# Patient Record
Sex: Male | Born: 1965 | Race: White | Hispanic: No | Marital: Married | Smoking: Former smoker
Health system: Southern US, Community
[De-identification: ages and names within clinical notes are randomized; demographics above are authoritative.]

## PROBLEM LIST (undated history)

## (undated) DIAGNOSIS — R51 Headache: Secondary | ICD-10-CM

## (undated) DIAGNOSIS — R7303 Prediabetes: Secondary | ICD-10-CM

## (undated) DIAGNOSIS — R519 Headache, unspecified: Secondary | ICD-10-CM

## (undated) DIAGNOSIS — T7840XA Allergy, unspecified, initial encounter: Secondary | ICD-10-CM

## (undated) DIAGNOSIS — E119 Type 2 diabetes mellitus without complications: Secondary | ICD-10-CM

## (undated) DIAGNOSIS — I10 Essential (primary) hypertension: Secondary | ICD-10-CM

## (undated) HISTORY — DX: Type 2 diabetes mellitus without complications: E11.9

## (undated) HISTORY — PX: SHOULDER SURGERY: SHX246

## (undated) HISTORY — DX: Allergy, unspecified, initial encounter: T78.40XA

## (undated) HISTORY — PX: OTHER SURGICAL HISTORY: SHX169

---

## 1996-12-20 HISTORY — PX: SHOULDER SURGERY: SHX246

## 2010-02-27 ENCOUNTER — Emergency Department (HOSPITAL_COMMUNITY): Admission: EM | Admit: 2010-02-27 | Discharge: 2010-02-27 | Payer: Self-pay | Admitting: Emergency Medicine

## 2010-03-18 ENCOUNTER — Encounter: Admission: RE | Admit: 2010-03-18 | Discharge: 2010-03-18 | Payer: Self-pay | Admitting: Family Medicine

## 2011-03-15 LAB — POCT I-STAT, CHEM 8
Calcium, Ion: 1.2 mmol/L (ref 1.12–1.32)
Glucose, Bld: 89 mg/dL (ref 70–99)
Sodium: 142 mEq/L (ref 135–145)
TCO2: 25 mmol/L (ref 0–100)

## 2011-03-15 LAB — CBC
HCT: 44.4 % (ref 39.0–52.0)
MCV: 87.8 fL (ref 78.0–100.0)
RBC: 5.06 MIL/uL (ref 4.22–5.81)
RDW: 12.8 % (ref 11.5–15.5)
WBC: 5.3 10*3/uL (ref 4.0–10.5)

## 2011-03-15 LAB — DIFFERENTIAL
Eosinophils Relative: 4 % (ref 0–5)
Lymphocytes Relative: 27 % (ref 12–46)
Monocytes Relative: 9 % (ref 3–12)
Neutro Abs: 3.1 10*3/uL (ref 1.7–7.7)

## 2011-03-15 LAB — POCT CARDIAC MARKERS
CKMB, poc: 1.9 ng/mL (ref 1.0–8.0)
Myoglobin, poc: 56.6 ng/mL (ref 12–200)
Troponin i, poc: <0.05 ng/mL (ref 0.00–0.09)

## 2017-04-13 ENCOUNTER — Ambulatory Visit: Payer: Self-pay | Admitting: Surgery

## 2017-04-13 ENCOUNTER — Other Ambulatory Visit: Payer: Self-pay | Admitting: Surgery

## 2017-04-13 DIAGNOSIS — D125 Benign neoplasm of sigmoid colon: Secondary | ICD-10-CM

## 2017-04-19 ENCOUNTER — Ambulatory Visit
Admission: RE | Admit: 2017-04-19 | Discharge: 2017-04-19 | Disposition: A | Discharge status: Home / Self Care | Payer: 59 | Source: Ambulatory Visit | Attending: Surgery | Admitting: Surgery

## 2017-04-19 DIAGNOSIS — D125 Benign neoplasm of sigmoid colon: Secondary | ICD-10-CM

## 2017-04-19 MED ORDER — IOPAMIDOL (ISOVUE-300) INJECTION 61%
125.0000 mL | Freq: Once | INTRAVENOUS | Status: AC | PRN
  Administered 2017-04-19: 125 mL via INTRAVENOUS

## 2017-04-20 ENCOUNTER — Ambulatory Visit: Payer: Self-pay | Admitting: Surgery

## 2017-04-20 DIAGNOSIS — D374 Neoplasm of uncertain behavior of colon: Secondary | ICD-10-CM | POA: Insufficient documentation

## 2017-05-31 NOTE — Patient Instructions (Signed)
William Adkins  05/31/2017   Your procedure is scheduled on: 06-08-17  Report to [facility] Main  Entrance Take [facility]  elevators to 3rd floor to  [place] [facility] [place].   Call this number if you have problems the morning of surgery [phone]   Remember: ONLY 1 PERSON MAY GO WITH YOU TO SHORT STAY TO GET  READY MORNING OF YOUR SURGERY.  Do not eat food After Midnight on Monday 06-06-17. Drink plenty of clear liquids all day Tuesday 06-07-17 and follow all of Dr Johney [place] bowel prep instructions. Continue clear liquids until 7am morning of surgery. Nothing by mouth after 7am!     Take these medicines the morning of surgery with A SIP OF WATER: amlodipine(norvasc), loratadine as needed, eye drops , nasal spray                                 You may not have any metal on your body including hair pins and              piercings  Do not wear jewelry, make-up, lotions, powders or perfumes, deodorant              Men may shave face and neck.   Do not bring valuables to the hospital. [place].  Contacts, dentures or bridgework may not be worn into surgery.  Leave suitcase in the car. After surgery it may be brought to your room.               Please read over the following fact sheets you were given: _____________________________________________________________________   COLON BOWEL PREP  Please follow the instructions carefully. It is important to clean out your bowels & take the prescribed antibiotic pills to lower your chances of a wound infection or abscess.   FIVE DAYS PRIOR TO YOUR SURGERY Stop eating any nuts, popcorn, or fruit with seeds. Stop all fiber supplements such as Metamucil, Citrucel, etc.   Hold taking any blood thinning anticoagulation medication (ex: aspirin, warfarin/Coumadin, Plavix, Xarelto, Eliquis, Pradaxa, etc) as recommended by your medical/cardiology doctor  Obtain what you need at a  pharmacy of your choice: -Filled out prescriptions for your oral antibiotics (Neomycin & Metronidazole)  -A bottle of MiraLax / Glycolax (288g) - no prescription required  -A large bottle of Gatorade / Powerade (64oz)  -Dulcolax tablets (4 tabs) - no prescription required   DAY PRIOR TO SURGERY   7:00am Swallow 4 Dulcolax tablets with some water Drink plenty of clear liquids all day to avoid getting dehydrated (Water, juice, soda, coffee, tea, bouillon, jello, etc.)  10:00am Mix the bottle of MiraLax with the 64-oz bottle of Gatorade.  Drink the Gatorade mixture gradually over the next few hours (8oz glass every 15-30 minutes) until gone. You should finish by 2pm.  2:00pm Take 2 Neomycin 500mg  tablets & 2 Metronidazole 500mg  tablets  3:00pm Take 2 Neomycin 500mg  tablets & 2 Metronidazole 500mg  tablets  Drink plenty of clear liquids all evening to avoid getting dehydrated  10:00pm Take 2 Neomycin 500mg  tablets & 2 Metronidazole 500mg  tablets  Do not eat or drink anything after bedtime (midnight) the night before your surgery.   MORNING OF SURGERY Remember to not to drink  or eat anything that morning  Hold or take medications as recommended by the hospital staff at your Preoperative visit  If you have questions or concerns, please call [facility] ([phone] during business hours to speak to the clinical staff for advice.     CLEAR LIQUID DIET   Foods Allowed                                                                     Foods Excluded  Coffee and tea, regular and decaf                             liquids that you cannot  Plain Jell-O in any flavor                                             see through such as: Fruit ices (not with fruit pulp)                                     milk, soups, orange juice  Iced Popsicles                                    All solid food Carbonated beverages, regular and diet                                     Cranberry, grape and apple juices Sports drinks like Gatorade Lightly seasoned clear broth or consume(fat free) Sugar, honey syrup  Sample Menu Breakfast                                Lunch                                     Supper Cranberry juice                    Beef broth                            Chicken broth Jell-O                                     Grape juice                           Apple juice Coffee or tea                        Jell-O  Popsicle                                                Coffee or tea                        Coffee or tea  _____________________________________________________________________  [facility] - Preparing for Surgery Before surgery, you can play an important role.  Because skin is not sterile, your skin needs to be as free of germs as possible.  You can reduce the number of germs on your skin by washing with CHG (chlorahexidine gluconate) soap before surgery.  CHG is an antiseptic cleaner which kills germs and bonds with the skin to continue killing germs even after washing. Please DO NOT use if you have an allergy to CHG or antibacterial soaps.  If your skin becomes reddened/irritated stop using the CHG and inform your nurse when you arrive at Short Stay. Do not shave (including legs and underarms) for at least 48 hours prior to the first CHG shower.  You may shave your face/neck. Please follow these instructions carefully:  1.  Shower with CHG Soap the night before surgery and the  morning of Surgery.  2.  If you choose to wash your hair, wash your hair first as usual with your  normal  shampoo.  3.  After you shampoo, rinse your hair and body thoroughly to remove the  shampoo.                           4.  Use CHG as you would any other liquid soap.  You can apply chg directly  to the skin and wash                       Gently with a scrungie or clean washcloth.  5.  Apply the CHG Soap to your body ONLY  FROM THE NECK DOWN.   Do not use on face/ open                           Wound or open sores. Avoid contact with eyes, ears mouth and genitals (private parts).                       Wash face,  Genitals (private parts) with your normal soap.             6.  Wash thoroughly, paying special attention to the area where your surgery  will be performed.  7.  Thoroughly rinse your body with warm water from the neck down.  8.  DO NOT shower/wash with your normal soap after using and rinsing off  the CHG Soap.                9.  Pat yourself dry with a clean towel.            10.  Wear clean pajamas.            11.  Place clean sheets on your bed the night of your first shower and do not  sleep with pets. Day of Surgery : Do not apply any lotions/deodorants the morning of surgery.  Please wear clean clothes to the hospital/surgery  center.  FAILURE TO FOLLOW THESE INSTRUCTIONS MAY RESULT IN THE CANCELLATION OF YOUR SURGERY PATIENT SIGNATURE_________________________________  NURSE SIGNATURE__________________________________  ________________________________________________________________________  WHAT IS A BLOOD TRANSFUSION? Blood Transfusion Information  A transfusion is the replacement of blood or some of its parts. Blood is made up of multiple cells which provide different functions.  Red blood cells carry oxygen and are used for blood loss replacement.  White blood cells fight against infection.  Platelets control bleeding.  Plasma helps clot blood.  Other blood products are available for specialized needs, such as hemophilia or other clotting disorders. BEFORE THE TRANSFUSION  Who gives blood for transfusions?   Healthy volunteers who are fully evaluated to make sure their blood is safe. This is blood bank blood. Transfusion therapy is the safest it has ever been in the practice of medicine. Before blood is taken from a donor, a complete history is taken to make sure that person has  no history of diseases nor engages in risky social behavior (examples are intravenous drug use or sexual activity with multiple partners). The donor's travel history is screened to minimize risk of transmitting infections, such as malaria. The donated blood is tested for signs of infectious diseases, such as HIV and hepatitis. The blood is then tested to be sure it is compatible with you in order to minimize the chance of a transfusion reaction. If you or a relative donates blood, this is often done in anticipation of surgery and is not appropriate for emergency situations. It takes many days to process the donated blood. RISKS AND COMPLICATIONS Although transfusion therapy is very safe and saves many lives, the main dangers of transfusion include:   Getting an infectious disease.  Developing a transfusion reaction. This is an allergic reaction to something in the blood you were given. Every precaution is taken to prevent this. The decision to have a blood transfusion has been considered carefully by your caregiver before blood is given. Blood is not given unless the benefits outweigh the risks. AFTER THE TRANSFUSION  Right after receiving a blood transfusion, you will usually feel much better and more energetic. This is especially true if your red blood cells have gotten low (anemic). The transfusion raises the level of the red blood cells which carry oxygen, and this usually causes an energy increase.  The nurse administering the transfusion will monitor you carefully for complications. HOME CARE INSTRUCTIONS  No special instructions are needed after a transfusion. You may find your energy is better. Speak with your caregiver about any limitations on activity for underlying diseases you may have. SEEK MEDICAL CARE IF:   Your condition is not improving after your transfusion.  You develop redness or irritation at the intravenous (IV) site. SEEK IMMEDIATE MEDICAL CARE IF:  Any of the following  symptoms occur over the next 12 hours:  Shaking chills.  You have a temperature by mouth above 102 F (38.9 C), not controlled by medicine.  Chest, back, or muscle pain.  People around you feel you are not acting correctly or are confused.  Shortness of breath or difficulty breathing.  Dizziness and fainting.  You get a rash or develop hives.  You have a decrease in urine output.  Your urine turns a dark color or changes to pink, red, or brown. Any of the following symptoms occur over the next 10 days:  You have a temperature by mouth above 102 F (38.9 C), not controlled by medicine.  Shortness of breath.  Weakness after normal  activity.  The white part of the eye turns yellow (jaundice).  You have a decrease in the amount of urine or are urinating less often.  Your urine turns a dark color or changes to pink, red, or brown. Document Released: 12/03/2000 Document Revised: 02/28/2012 Document Reviewed: 07/22/2008 [facility] Patient Information 2014 [place], [place].  _______________________________________________________________________

## 2017-06-01 ENCOUNTER — Encounter (HOSPITAL_COMMUNITY)
Admission: RE | Admit: 2017-06-01 | Discharge: 2017-06-01 | Disposition: A | Discharge status: Home / Self Care | Payer: 59 | Source: Ambulatory Visit | Attending: Surgery | Admitting: Surgery

## 2017-06-01 ENCOUNTER — Encounter (HOSPITAL_COMMUNITY): Payer: Self-pay

## 2017-06-01 DIAGNOSIS — Z01812 Encounter for preprocedural laboratory examination: Secondary | ICD-10-CM | POA: Diagnosis not present

## 2017-06-01 DIAGNOSIS — Z01810 Encounter for preprocedural cardiovascular examination: Secondary | ICD-10-CM | POA: Diagnosis not present

## 2017-06-01 DIAGNOSIS — I10 Essential (primary) hypertension: Secondary | ICD-10-CM | POA: Diagnosis not present

## 2017-06-01 HISTORY — DX: Essential (primary) hypertension: I10

## 2017-06-01 HISTORY — DX: Headache, unspecified: R51.9

## 2017-06-01 HISTORY — DX: Headache: R51

## 2017-06-01 HISTORY — DX: Prediabetes: R73.03

## 2017-06-01 LAB — CBC
HEMATOCRIT: 42.4 % (ref 39.0–52.0)
HEMOGLOBIN: 14 g/dL (ref 13.0–17.0)
MCH: 28.9 pg (ref 26.0–34.0)
MCHC: 33 g/dL (ref 30.0–36.0)
MCV: 87.4 fL (ref 78.0–100.0)
Platelets: 281 10*3/uL (ref 150–400)
RBC: 4.85 MIL/uL (ref 4.22–5.81)
RDW: 13.5 % (ref 11.5–15.5)
WBC: 5.4 10*3/uL (ref 4.0–10.5)

## 2017-06-01 LAB — BASIC METABOLIC PANEL
ANION GAP: 8 (ref 5–15)
BUN: 23 mg/dL — ABNORMAL HIGH (ref 6–20)
CO2: 28 mmol/L (ref 22–32)
Calcium: 9.2 mg/dL (ref 8.9–10.3)
Chloride: 108 mmol/L (ref 101–111)
Creatinine, Ser: 0.78 mg/dL (ref 0.61–1.24)
GFR calc Af Amer: >60 mL/min (ref >60)
GLUCOSE: 92 mg/dL (ref 65–99)
POTASSIUM: 4.6 mmol/L (ref 3.5–5.1)
SODIUM: 144 mmol/L (ref 135–145)

## 2017-06-01 LAB — ABO/RH: ABO/RH(D): A POS

## 2017-06-02 LAB — HEMOGLOBIN A1C
Hgb A1c MFr Bld: 6.3 % — ABNORMAL HIGH (ref 4.8–5.6)
MEAN PLASMA GLUCOSE: 134 mg/dL

## 2017-06-02 LAB — CEA: CEA: 1.6 ng/mL (ref 0.0–4.7)

## 2017-06-08 ENCOUNTER — Inpatient Hospital Stay (HOSPITAL_COMMUNITY): Payer: 59 | Admitting: Certified Registered Nurse Anesthetist

## 2017-06-08 ENCOUNTER — Encounter (HOSPITAL_COMMUNITY): Payer: Self-pay | Admitting: *Deleted

## 2017-06-08 ENCOUNTER — Encounter (HOSPITAL_COMMUNITY)
Admission: RE | Disposition: A | Discharge status: Home / Self Care | Payer: Self-pay | Source: Ambulatory Visit | Attending: Surgery

## 2017-06-08 ENCOUNTER — Inpatient Hospital Stay (HOSPITAL_COMMUNITY)
Admission: RE | Admit: 2017-06-08 | Discharge: 2017-06-10 | DRG: 331 | Disposition: A | Discharge status: Home / Self Care | Payer: 59 | Source: Ambulatory Visit | Attending: Surgery | Admitting: Surgery

## 2017-06-08 DIAGNOSIS — Z803 Family history of malignant neoplasm of breast: Secondary | ICD-10-CM

## 2017-06-08 DIAGNOSIS — D124 Benign neoplasm of descending colon: Principal | ICD-10-CM | POA: Diagnosis present

## 2017-06-08 DIAGNOSIS — Z833 Family history of diabetes mellitus: Secondary | ICD-10-CM | POA: Diagnosis not present

## 2017-06-08 DIAGNOSIS — Z8249 Family history of ischemic heart disease and other diseases of the circulatory system: Secondary | ICD-10-CM

## 2017-06-08 DIAGNOSIS — K219 Gastro-esophageal reflux disease without esophagitis: Secondary | ICD-10-CM | POA: Diagnosis present

## 2017-06-08 DIAGNOSIS — I10 Essential (primary) hypertension: Secondary | ICD-10-CM | POA: Diagnosis present

## 2017-06-08 DIAGNOSIS — D374 Neoplasm of uncertain behavior of colon: Secondary | ICD-10-CM | POA: Diagnosis present

## 2017-06-08 DIAGNOSIS — R7303 Prediabetes: Secondary | ICD-10-CM | POA: Diagnosis present

## 2017-06-08 DIAGNOSIS — Z800 Family history of malignant neoplasm of digestive organs: Secondary | ICD-10-CM

## 2017-06-08 DIAGNOSIS — Z87891 Personal history of nicotine dependence: Secondary | ICD-10-CM | POA: Diagnosis not present

## 2017-06-08 DIAGNOSIS — K639 Disease of intestine, unspecified: Secondary | ICD-10-CM | POA: Diagnosis present

## 2017-06-08 DIAGNOSIS — Z8261 Family history of arthritis: Secondary | ICD-10-CM | POA: Diagnosis not present

## 2017-06-08 DIAGNOSIS — Z8371 Family history of colonic polyps: Secondary | ICD-10-CM

## 2017-06-08 LAB — TYPE AND SCREEN
ABO/RH(D): A POS
ANTIBODY SCREEN: NEGATIVE

## 2017-06-08 SURGERY — COLECTOMY, PARTIAL, ROBOT-ASSISTED, LAPAROSCOPIC
Anesthesia: General | Site: Abdomen

## 2017-06-08 MED ORDER — AMLODIPINE BESYLATE 10 MG PO TABS
10.0000 mg | ORAL_TABLET | Freq: Every day | ORAL | Status: DC
  Administered 2017-06-08 – 2017-06-10 (×3): 10 mg via ORAL
  Filled 2017-06-08 (×3): qty 1

## 2017-06-08 MED ORDER — SUCCINYLCHOLINE CHLORIDE 20 MG/ML IJ SOLN
INTRAMUSCULAR | Status: DC | PRN
  Administered 2017-06-08: 140 mg via INTRAVENOUS

## 2017-06-08 MED ORDER — DEXTROSE 5 % IV SOLN
2.0000 g | INTRAVENOUS | Status: AC
  Administered 2017-06-08: 2 g via INTRAVENOUS
  Filled 2017-06-08: qty 2

## 2017-06-08 MED ORDER — LIP MEDEX EX OINT
1.0000 "application " | TOPICAL_OINTMENT | Freq: Two times a day (BID) | CUTANEOUS | Status: DC
  Administered 2017-06-08 – 2017-06-09 (×3): 1 via TOPICAL
  Filled 2017-06-08: qty 7

## 2017-06-08 MED ORDER — IBUPROFEN 200 MG PO TABS
400.0000 mg | ORAL_TABLET | Freq: Three times a day (TID) | ORAL | Status: DC | PRN
  Administered 2017-06-09: 600 mg via ORAL
  Filled 2017-06-08: qty 3

## 2017-06-08 MED ORDER — EPHEDRINE SULFATE 50 MG/ML IJ SOLN
INTRAMUSCULAR | Status: DC | PRN
  Administered 2017-06-08: 10 mg via INTRAVENOUS
  Administered 2017-06-08: 5 mg via INTRAVENOUS

## 2017-06-08 MED ORDER — DEXAMETHASONE SODIUM PHOSPHATE 10 MG/ML IJ SOLN
INTRAMUSCULAR | Status: AC
  Filled 2017-06-08: qty 1

## 2017-06-08 MED ORDER — METRONIDAZOLE 500 MG PO TABS: 1000.0000 mg | ORAL_TABLET | ORAL | Status: DC

## 2017-06-08 MED ORDER — OXYMETAZOLINE HCL 0.05 % NA SOLN: 2.0000 | Freq: Two times a day (BID) | NASAL | Status: DC | PRN

## 2017-06-08 MED ORDER — DEXAMETHASONE SODIUM PHOSPHATE 4 MG/ML IJ SOLN
INTRAMUSCULAR | Status: DC | PRN
  Administered 2017-06-08: 10 mg via INTRAVENOUS

## 2017-06-08 MED ORDER — LIDOCAINE 2% (20 MG/ML) 5 ML SYRINGE
INTRAMUSCULAR | Status: AC
  Filled 2017-06-08: qty 10

## 2017-06-08 MED ORDER — LIDOCAINE HCL (CARDIAC) 20 MG/ML IV SOLN
INTRAVENOUS | Status: DC | PRN
  Administered 2017-06-08: 80 mg via INTRAVENOUS

## 2017-06-08 MED ORDER — SODIUM CHLORIDE 0.9% FLUSH: 3.0000 mL | Freq: Two times a day (BID) | INTRAVENOUS | Status: DC

## 2017-06-08 MED ORDER — PROCHLORPERAZINE EDISYLATE 5 MG/ML IJ SOLN: 5.0000 mg | INTRAMUSCULAR | Status: DC | PRN

## 2017-06-08 MED ORDER — ONDANSETRON HCL 4 MG PO TABS: 4.0000 mg | ORAL_TABLET | Freq: Four times a day (QID) | ORAL | Status: DC | PRN

## 2017-06-08 MED ORDER — SODIUM CHLORIDE 0.9 % IJ SOLN
INTRAMUSCULAR | Status: AC
  Filled 2017-06-08: qty 50

## 2017-06-08 MED ORDER — BUPIVACAINE LIPOSOME 1.3 % IJ SUSP
20.0000 mL | INTRAMUSCULAR | Status: DC
  Filled 2017-06-08 (×2): qty 20

## 2017-06-08 MED ORDER — TRAMADOL HCL 50 MG PO TABS: 50.0000 mg | ORAL_TABLET | Freq: Four times a day (QID) | ORAL | 0 refills | Status: DC | PRN

## 2017-06-08 MED ORDER — NEOMYCIN SULFATE 500 MG PO TABS: 1000.0000 mg | ORAL_TABLET | ORAL | Status: DC

## 2017-06-08 MED ORDER — PROPOFOL 10 MG/ML IV BOLUS
INTRAVENOUS | Status: AC
  Filled 2017-06-08: qty 20

## 2017-06-08 MED ORDER — SUCCINYLCHOLINE CHLORIDE 200 MG/10ML IV SOSY
PREFILLED_SYRINGE | INTRAVENOUS | Status: AC
  Filled 2017-06-08: qty 10

## 2017-06-08 MED ORDER — SODIUM CHLORIDE 0.9 % IV SOLN: 250.0000 mL | INTRAVENOUS | Status: DC | PRN

## 2017-06-08 MED ORDER — EPHEDRINE 5 MG/ML INJ
INTRAVENOUS | Status: AC
  Filled 2017-06-08: qty 10

## 2017-06-08 MED ORDER — IRBESARTAN 150 MG PO TABS
150.0000 mg | ORAL_TABLET | Freq: Every day | ORAL | Status: DC
  Administered 2017-06-08 – 2017-06-10 (×3): 150 mg via ORAL
  Filled 2017-06-08 (×3): qty 1

## 2017-06-08 MED ORDER — ALVIMOPAN 12 MG PO CAPS
12.0000 mg | ORAL_CAPSULE | Freq: Once | ORAL | Status: AC
  Administered 2017-06-08: 12 mg via ORAL
  Filled 2017-06-08: qty 1

## 2017-06-08 MED ORDER — SACCHAROMYCES BOULARDII 250 MG PO CAPS
250.0000 mg | ORAL_CAPSULE | Freq: Two times a day (BID) | ORAL | Status: DC
  Administered 2017-06-08 – 2017-06-10 (×4): 250 mg via ORAL
  Filled 2017-06-08 (×4): qty 1

## 2017-06-08 MED ORDER — LABETALOL HCL 5 MG/ML IV SOLN
INTRAVENOUS | Status: AC
  Filled 2017-06-08: qty 4

## 2017-06-08 MED ORDER — BUPIVACAINE LIPOSOME 1.3 % IJ SUSP
INTRAMUSCULAR | Status: DC | PRN
  Administered 2017-06-08: 20 mL

## 2017-06-08 MED ORDER — HYDROCORTISONE 2.5 % RE CREA
1.0000 "application " | TOPICAL_CREAM | Freq: Four times a day (QID) | RECTAL | Status: DC | PRN
  Filled 2017-06-08: qty 28.35

## 2017-06-08 MED ORDER — LABETALOL HCL 5 MG/ML IV SOLN
INTRAVENOUS | Status: DC | PRN
  Administered 2017-06-08 (×2): 5 mg via INTRAVENOUS

## 2017-06-08 MED ORDER — FENTANYL CITRATE (PF) 250 MCG/5ML IJ SOLN
INTRAMUSCULAR | Status: AC
  Filled 2017-06-08: qty 5

## 2017-06-08 MED ORDER — LIDOCAINE 2% (20 MG/ML) 5 ML SYRINGE
INTRAMUSCULAR | Status: AC
  Filled 2017-06-08: qty 15

## 2017-06-08 MED ORDER — ROCURONIUM BROMIDE 100 MG/10ML IV SOLN
INTRAVENOUS | Status: DC | PRN
  Administered 2017-06-08 (×4): 20 mg via INTRAVENOUS
  Administered 2017-06-08: 50 mg via INTRAVENOUS

## 2017-06-08 MED ORDER — ENOXAPARIN SODIUM 40 MG/0.4ML ~~LOC~~ SOLN
40.0000 mg | SUBCUTANEOUS | Status: DC
  Administered 2017-06-09 – 2017-06-10 (×2): 40 mg via SUBCUTANEOUS
  Filled 2017-06-08 (×2): qty 0.4

## 2017-06-08 MED ORDER — HYDROMORPHONE HCL 1 MG/ML IJ SOLN
INTRAMUSCULAR | Status: AC
  Filled 2017-06-08: qty 1

## 2017-06-08 MED ORDER — PROPOFOL 10 MG/ML IV BOLUS
INTRAVENOUS | Status: DC | PRN
  Administered 2017-06-08: 200 mg via INTRAVENOUS

## 2017-06-08 MED ORDER — LIDOCAINE 2% (20 MG/ML) 5 ML SYRINGE
INTRAMUSCULAR | Status: DC | PRN
  Administered 2017-06-08: 1.5 mg/kg/h via INTRAVENOUS

## 2017-06-08 MED ORDER — HYDROCORTISONE 1 % EX CREA: 1.0000 "application " | TOPICAL_CREAM | Freq: Three times a day (TID) | CUTANEOUS | Status: DC | PRN

## 2017-06-08 MED ORDER — LACTATED RINGERS IV SOLN
INTRAVENOUS | Status: DC
  Administered 2017-06-08: 11:00:00 via INTRAVENOUS
  Administered 2017-06-08: 1000 mL via INTRAVENOUS
  Administered 2017-06-08: 14:00:00 via INTRAVENOUS

## 2017-06-08 MED ORDER — CAFFEINE 200 MG PO TABS: 200.0000 mg | ORAL_TABLET | Freq: Two times a day (BID) | ORAL | Status: DC

## 2017-06-08 MED ORDER — ADULT MULTIVITAMIN W/MINERALS CH
1.0000 | ORAL_TABLET | Freq: Every day | ORAL | Status: DC
  Administered 2017-06-09 – 2017-06-10 (×2): 1 via ORAL
  Filled 2017-06-08 (×2): qty 1

## 2017-06-08 MED ORDER — DIPHENHYDRAMINE HCL 50 MG/ML IJ SOLN: 12.5000 mg | Freq: Four times a day (QID) | INTRAMUSCULAR | Status: DC | PRN

## 2017-06-08 MED ORDER — ALUM & MAG HYDROXIDE-SIMETH 200-200-20 MG/5ML PO SUSP: 30.0000 mL | Freq: Four times a day (QID) | ORAL | Status: DC | PRN

## 2017-06-08 MED ORDER — PHENYLEPHRINE HCL 10 MG/ML IJ SOLN
INTRAMUSCULAR | Status: DC | PRN
  Administered 2017-06-08: 80 ug via INTRAVENOUS

## 2017-06-08 MED ORDER — TETRAHYDROZOLINE HCL 0.05 % OP SOLN: 2.0000 [drp] | Freq: Two times a day (BID) | OPHTHALMIC | Status: DC | PRN

## 2017-06-08 MED ORDER — LACTATED RINGERS IR SOLN
Status: DC | PRN
  Administered 2017-06-08: 1000 mL

## 2017-06-08 MED ORDER — ALVIMOPAN 12 MG PO CAPS: 12.0000 mg | ORAL_CAPSULE | Freq: Two times a day (BID) | ORAL | Status: DC

## 2017-06-08 MED ORDER — SUGAMMADEX SODIUM 200 MG/2ML IV SOLN
INTRAVENOUS | Status: AC
  Filled 2017-06-08: qty 2

## 2017-06-08 MED ORDER — VITAMIN C 500 MG PO TABS
1000.0000 mg | ORAL_TABLET | Freq: Every day | ORAL | Status: DC
  Administered 2017-06-09 – 2017-06-10 (×2): 1000 mg via ORAL
  Filled 2017-06-08 (×2): qty 2

## 2017-06-08 MED ORDER — HYDROMORPHONE HCL 1 MG/ML IJ SOLN
0.5000 mg | INTRAMUSCULAR | Status: DC | PRN
  Administered 2017-06-08: 1 mg via INTRAVENOUS
  Filled 2017-06-08: qty 1

## 2017-06-08 MED ORDER — SUGAMMADEX SODIUM 200 MG/2ML IV SOLN
INTRAVENOUS | Status: DC | PRN
  Administered 2017-06-08: 300 mg via INTRAVENOUS

## 2017-06-08 MED ORDER — LACTATED RINGERS IV SOLN: 1000.0000 mL | Freq: Three times a day (TID) | INTRAVENOUS | Status: DC | PRN

## 2017-06-08 MED ORDER — ENSURE SURGERY PO LIQD
237.0000 mL | Freq: Two times a day (BID) | ORAL | Status: DC
  Administered 2017-06-08 – 2017-06-09 (×2): 237 mL via ORAL
  Filled 2017-06-08 (×5): qty 237

## 2017-06-08 MED ORDER — MIDAZOLAM HCL 5 MG/5ML IJ SOLN
INTRAMUSCULAR | Status: DC | PRN
  Administered 2017-06-08: 2 mg via INTRAVENOUS

## 2017-06-08 MED ORDER — BUPIVACAINE-EPINEPHRINE (PF) 0.25% -1:200000 IJ SOLN
INTRAMUSCULAR | Status: AC
  Filled 2017-06-08: qty 30

## 2017-06-08 MED ORDER — KETOROLAC TROMETHAMINE 30 MG/ML IJ SOLN: 30.0000 mg | Freq: Once | INTRAMUSCULAR | Status: DC | PRN

## 2017-06-08 MED ORDER — BISACODYL 5 MG PO TBEC
20.0000 mg | DELAYED_RELEASE_TABLET | Freq: Once | ORAL | Status: DC
  Filled 2017-06-08: qty 4

## 2017-06-08 MED ORDER — ROCURONIUM BROMIDE 50 MG/5ML IV SOSY
PREFILLED_SYRINGE | INTRAVENOUS | Status: AC
  Filled 2017-06-08: qty 5

## 2017-06-08 MED ORDER — MENTHOL 3 MG MT LOZG: 1.0000 | LOZENGE | OROMUCOSAL | Status: DC | PRN

## 2017-06-08 MED ORDER — ENALAPRILAT 1.25 MG/ML IV SOLN
0.6250 mg | Freq: Four times a day (QID) | INTRAVENOUS | Status: DC | PRN
  Filled 2017-06-08: qty 1

## 2017-06-08 MED ORDER — POLYETHYLENE GLYCOL 3350 17 GM/SCOOP PO POWD: 1.0000 | Freq: Once | ORAL | Status: DC

## 2017-06-08 MED ORDER — LIDOCAINE 2% (20 MG/ML) 5 ML SYRINGE
INTRAMUSCULAR | Status: AC
  Filled 2017-06-08: qty 5

## 2017-06-08 MED ORDER — PROMETHAZINE HCL 25 MG/ML IJ SOLN: 6.2500 mg | INTRAMUSCULAR | Status: DC | PRN

## 2017-06-08 MED ORDER — HYDROMORPHONE HCL 1 MG/ML IJ SOLN
0.2500 mg | INTRAMUSCULAR | Status: DC | PRN
  Administered 2017-06-08 (×4): 0.5 mg via INTRAVENOUS

## 2017-06-08 MED ORDER — GABAPENTIN 300 MG PO CAPS
300.0000 mg | ORAL_CAPSULE | ORAL | Status: AC
  Administered 2017-06-08: 300 mg via ORAL
  Filled 2017-06-08: qty 1

## 2017-06-08 MED ORDER — SODIUM CHLORIDE 0.9 % IJ SOLN
INTRAMUSCULAR | Status: DC | PRN
  Administered 2017-06-08: 50 mL

## 2017-06-08 MED ORDER — ACETAMINOPHEN 500 MG PO TABS
1000.0000 mg | ORAL_TABLET | Freq: Three times a day (TID) | ORAL | Status: DC
  Administered 2017-06-08 – 2017-06-09 (×5): 1000 mg via ORAL
  Filled 2017-06-08 (×6): qty 2

## 2017-06-08 MED ORDER — BUPIVACAINE-EPINEPHRINE (PF) 0.25% -1:200000 IJ SOLN
INTRAMUSCULAR | Status: DC | PRN
  Administered 2017-06-08: 30 mL via PERINEURAL

## 2017-06-08 MED ORDER — SODIUM CHLORIDE 0.9 % IV SOLN
INTRAVENOUS | Status: DC
  Administered 2017-06-08: 50 mL/h via INTRAVENOUS

## 2017-06-08 MED ORDER — SUGAMMADEX SODIUM 200 MG/2ML IV SOLN
INTRAVENOUS | Status: AC
  Filled 2017-06-08: qty 4

## 2017-06-08 MED ORDER — ONDANSETRON HCL 4 MG/2ML IJ SOLN
INTRAMUSCULAR | Status: DC | PRN
  Administered 2017-06-08: 4 mg via INTRAVENOUS

## 2017-06-08 MED ORDER — DIPHENHYDRAMINE HCL 12.5 MG/5ML PO ELIX: 12.5000 mg | ORAL_SOLUTION | Freq: Four times a day (QID) | ORAL | Status: DC | PRN

## 2017-06-08 MED ORDER — GENTAMICIN SULFATE 40 MG/ML IJ SOLN
INTRAMUSCULAR | Status: DC | PRN
  Administered 2017-06-08: 1000 mL via INTRAPERITONEAL

## 2017-06-08 MED ORDER — PHENOL 1.4 % MT LIQD: 1.0000 | OROMUCOSAL | Status: DC | PRN

## 2017-06-08 MED ORDER — FENTANYL CITRATE (PF) 100 MCG/2ML IJ SOLN
INTRAMUSCULAR | Status: DC | PRN
  Administered 2017-06-08: 100 ug via INTRAVENOUS
  Administered 2017-06-08 (×3): 50 ug via INTRAVENOUS
  Administered 2017-06-08 (×2): 100 ug via INTRAVENOUS

## 2017-06-08 MED ORDER — ONDANSETRON HCL 4 MG/2ML IJ SOLN: 4.0000 mg | Freq: Four times a day (QID) | INTRAMUSCULAR | Status: DC | PRN

## 2017-06-08 MED ORDER — METOPROLOL TARTRATE 5 MG/5ML IV SOLN: 5.0000 mg | Freq: Four times a day (QID) | INTRAVENOUS | Status: DC | PRN

## 2017-06-08 MED ORDER — CELECOXIB 200 MG PO CAPS
400.0000 mg | ORAL_CAPSULE | ORAL | Status: AC
  Administered 2017-06-08: 400 mg via ORAL
  Filled 2017-06-08: qty 2

## 2017-06-08 MED ORDER — FENTANYL CITRATE (PF) 100 MCG/2ML IJ SOLN
INTRAMUSCULAR | Status: AC
  Filled 2017-06-08: qty 2

## 2017-06-08 MED ORDER — GUAIFENESIN-DM 100-10 MG/5ML PO SYRP: 10.0000 mL | ORAL_SOLUTION | ORAL | Status: DC | PRN

## 2017-06-08 MED ORDER — ACETAMINOPHEN 500 MG PO TABS
1000.0000 mg | ORAL_TABLET | ORAL | Status: AC
  Administered 2017-06-08: 1000 mg via ORAL
  Filled 2017-06-08: qty 2

## 2017-06-08 MED ORDER — DEXTROSE 5 % IV SOLN
2.0000 g | Freq: Two times a day (BID) | INTRAVENOUS | Status: AC
  Administered 2017-06-08: 2 g via INTRAVENOUS
  Filled 2017-06-08: qty 2

## 2017-06-08 MED ORDER — KETAMINE HCL 10 MG/ML IJ SOLN
INTRAMUSCULAR | Status: DC | PRN
  Administered 2017-06-08 (×2): 20 mg via INTRAVENOUS
  Administered 2017-06-08: 10 mg via INTRAVENOUS

## 2017-06-08 MED ORDER — ZOLPIDEM TARTRATE 5 MG PO TABS: 5.0000 mg | ORAL_TABLET | Freq: Every evening | ORAL | Status: DC | PRN

## 2017-06-08 MED ORDER — SODIUM CHLORIDE 0.9% FLUSH: 3.0000 mL | INTRAVENOUS | Status: DC | PRN

## 2017-06-08 MED ORDER — GENTAMICIN SULFATE 40 MG/ML IJ SOLN
INTRAMUSCULAR | Status: DC
  Filled 2017-06-08: qty 6

## 2017-06-08 MED ORDER — ENOXAPARIN SODIUM 40 MG/0.4ML ~~LOC~~ SOLN
40.0000 mg | Freq: Once | SUBCUTANEOUS | Status: AC
  Administered 2017-06-08: 40 mg via SUBCUTANEOUS
  Filled 2017-06-08: qty 0.4

## 2017-06-08 MED ORDER — MAGIC MOUTHWASH
15.0000 mL | Freq: Four times a day (QID) | ORAL | Status: DC | PRN
  Filled 2017-06-08: qty 15

## 2017-06-08 MED ORDER — METHOCARBAMOL 750 MG PO TABS: 750.0000 mg | ORAL_TABLET | Freq: Four times a day (QID) | ORAL | 2 refills | Status: DC | PRN

## 2017-06-08 MED ORDER — LORATADINE 10 MG PO TABS: 10.0000 mg | ORAL_TABLET | Freq: Every day | ORAL | Status: DC | PRN

## 2017-06-08 MED ORDER — MIDAZOLAM HCL 2 MG/2ML IJ SOLN
INTRAMUSCULAR | Status: AC
  Filled 2017-06-08: qty 2

## 2017-06-08 MED ORDER — ONDANSETRON HCL 4 MG/2ML IJ SOLN
INTRAMUSCULAR | Status: AC
  Filled 2017-06-08: qty 2

## 2017-06-08 SURGICAL SUPPLY — 102 items
APPLIER CLIP 5 13 M/L LIGAMAX5 (MISCELLANEOUS)
APPLIER CLIP ROT 10 11.4 M/L (STAPLE)
BLADE EXTENDED COATED 6.5IN (ELECTRODE) ×3 IMPLANT
CANNULA REDUC XI 12-8 STAPL (CANNULA) ×1
CANNULA REDUC XI 12-8MM STAPL (CANNULA) ×1
CANNULA REDUCER 12-8 DVNC XI (CANNULA) ×1 IMPLANT
CELLS DAT CNTRL 66122 CELL SVR (MISCELLANEOUS) IMPLANT
CHLORAPREP W/TINT 26ML (MISCELLANEOUS) ×3 IMPLANT
CLIP APPLIE 5 13 M/L LIGAMAX5 (MISCELLANEOUS) IMPLANT
CLIP APPLIE ROT 10 11.4 M/L (STAPLE) IMPLANT
CLIP LIGATING HEM O LOK PURPLE (MISCELLANEOUS) IMPLANT
CLIP LIGATING HEMO O LOK GREEN (MISCELLANEOUS) IMPLANT
COUNTER NEEDLE 20 DBL MAG RED (NEEDLE) IMPLANT
COVER SURGICAL LIGHT HANDLE (MISCELLANEOUS) ×3 IMPLANT
COVER TIP SHEARS 8 DVNC (MISCELLANEOUS) ×1 IMPLANT
COVER TIP SHEARS 8MM DA VINCI (MISCELLANEOUS) ×2
DECANTER SPIKE VIAL GLASS SM (MISCELLANEOUS) ×3 IMPLANT
DEVICE TROCAR PUNCTURE CLOSURE (ENDOMECHANICALS) IMPLANT
DRAIN CHANNEL 19F RND (DRAIN) ×3 IMPLANT
DRAPE ARM DVNC X/XI (DISPOSABLE) ×3 IMPLANT
DRAPE COLUMN DVNC XI (DISPOSABLE) ×1 IMPLANT
DRAPE DA VINCI XI ARM (DISPOSABLE) ×6
DRAPE DA VINCI XI COLUMN (DISPOSABLE) ×2
DRAPE SURG IRRIG POUCH 19X23 (DRAPES) ×3 IMPLANT
DRSG OPSITE POSTOP 4X10 (GAUZE/BANDAGES/DRESSINGS) IMPLANT
DRSG OPSITE POSTOP 4X6 (GAUZE/BANDAGES/DRESSINGS) ×3 IMPLANT
DRSG OPSITE POSTOP 4X8 (GAUZE/BANDAGES/DRESSINGS) IMPLANT
DRSG TEGADERM 2-3/8X2-3/4 SM (GAUZE/BANDAGES/DRESSINGS) ×6 IMPLANT
DRSG TEGADERM 4X4.75 (GAUZE/BANDAGES/DRESSINGS) ×3 IMPLANT
ELECT PENCIL ROCKER SW 15FT (MISCELLANEOUS) IMPLANT
ELECT REM PT RETURN 15FT ADLT (MISCELLANEOUS) ×3 IMPLANT
ENDOLOOP SUT PDS II  0 18 (SUTURE)
ENDOLOOP SUT PDS II 0 18 (SUTURE) IMPLANT
EVACUATOR SILICONE 100CC (DRAIN) ×3 IMPLANT
GAUZE SPONGE 2X2 8PLY STRL LF (GAUZE/BANDAGES/DRESSINGS) ×1 IMPLANT
GAUZE SPONGE 4X4 12PLY STRL (GAUZE/BANDAGES/DRESSINGS) IMPLANT
GLOVE ECLIPSE 8.0 STRL XLNG CF (GLOVE) ×15 IMPLANT
GLOVE INDICATOR 8.0 STRL GRN (GLOVE) ×15 IMPLANT
GOWN STRL REUS W/TWL XL LVL3 (GOWN DISPOSABLE) ×15 IMPLANT
GRASPER ENDOPATH ANVIL 10MM (MISCELLANEOUS) IMPLANT
HOLDER FOLEY CATH W/STRAP (MISCELLANEOUS) ×3 IMPLANT
IRRIG SUCT STRYKERFLOW 2 WTIP (MISCELLANEOUS) ×3
IRRIGATION SUCT STRKRFLW 2 WTP (MISCELLANEOUS) ×1 IMPLANT
KIT PROCEDURE DA VINCI SI (MISCELLANEOUS) ×2
KIT PROCEDURE DVNC SI (MISCELLANEOUS) ×1 IMPLANT
LEGGING LITHOTOMY PAIR STRL (DRAPES) ×3 IMPLANT
LUBRICANT JELLY K Y 4OZ (MISCELLANEOUS) ×3 IMPLANT
NEEDLE INSUFFLATION 14GA 120MM (NEEDLE) ×3 IMPLANT
PACK CARDIOVASCULAR III (CUSTOM PROCEDURE TRAY) ×3 IMPLANT
PACK COLON (CUSTOM PROCEDURE TRAY) ×3 IMPLANT
PAD POSITIONING PINK XL (MISCELLANEOUS) ×3 IMPLANT
PORT LAP GEL ALEXIS MED 5-9CM (MISCELLANEOUS) ×3 IMPLANT
RTRCTR WOUND ALEXIS 18CM MED (MISCELLANEOUS)
SCISSORS LAP 5X35 DISP (ENDOMECHANICALS) ×3 IMPLANT
SEAL CANN UNIV 5-8 DVNC XI (MISCELLANEOUS) ×3 IMPLANT
SEAL XI 5MM-8MM UNIVERSAL (MISCELLANEOUS) ×6
SEALER VESSEL DA VINCI XI (MISCELLANEOUS) ×2
SEALER VESSEL EXT DVNC XI (MISCELLANEOUS) ×1 IMPLANT
SLEEVE ADV FIXATION 5X100MM (TROCAR) ×3 IMPLANT
SOLUTION ELECTROLUBE (MISCELLANEOUS) ×3 IMPLANT
SPONGE GAUZE 2X2 STER 10/PKG (GAUZE/BANDAGES/DRESSINGS) ×2
STAPLER 45 BLU RELOAD XI (STAPLE) IMPLANT
STAPLER 45 BLUE RELOAD XI (STAPLE)
STAPLER 45 GREEN RELOAD XI (STAPLE) ×2
STAPLER 45 GRN RELOAD XI (STAPLE) ×1 IMPLANT
STAPLER CANNULA SEAL DVNC XI (STAPLE) ×1 IMPLANT
STAPLER CANNULA SEAL XI (STAPLE) ×2
STAPLER CIRC ILS CVD 33MM 37CM (STAPLE) ×3 IMPLANT
STAPLER SHEATH (SHEATH) ×2
STAPLER SHEATH ENDOWRIST DVNC (SHEATH) ×1 IMPLANT
SUT MNCRL AB 4-0 PS2 18 (SUTURE) ×6 IMPLANT
SUT PDS AB 1 CTX 36 (SUTURE) IMPLANT
SUT PDS AB 1 TP1 96 (SUTURE) ×6 IMPLANT
SUT PDS AB 2-0 CT2 27 (SUTURE) IMPLANT
SUT PROLENE 0 CT 2 (SUTURE) ×3 IMPLANT
SUT PROLENE 2 0 KS (SUTURE) IMPLANT
SUT PROLENE 2 0 SH DA (SUTURE) IMPLANT
SUT SILK 2 0 (SUTURE) ×2
SUT SILK 2 0 SH CR/8 (SUTURE) ×3 IMPLANT
SUT SILK 2-0 18XBRD TIE 12 (SUTURE) ×1 IMPLANT
SUT SILK 3 0 (SUTURE) ×2
SUT SILK 3 0 SH CR/8 (SUTURE) ×3 IMPLANT
SUT SILK 3-0 18XBRD TIE 12 (SUTURE) ×1 IMPLANT
SUT V-LOC BARB 180 2/0GR6 GS22 (SUTURE)
SUT VIC AB 2-0 SH 27 (SUTURE) ×2
SUT VIC AB 2-0 SH 27X BRD (SUTURE) ×1 IMPLANT
SUT VIC AB 3-0 SH 18 (SUTURE) ×3 IMPLANT
SUT VIC AB 3-0 SH 27 (SUTURE) ×2
SUT VIC AB 3-0 SH 27XBRD (SUTURE) ×1 IMPLANT
SUT VICRYL 0 UR6 27IN ABS (SUTURE) ×6 IMPLANT
SUTURE V-LC BRB 180 2/0GR6GS22 (SUTURE) IMPLANT
SYR 10ML LL (SYRINGE) ×3 IMPLANT
SYS LAPSCP GELPORT 120MM (MISCELLANEOUS)
SYSTEM LAPSCP GELPORT 120MM (MISCELLANEOUS) IMPLANT
TAPE UMBILICAL COTTON 1/8X30 (MISCELLANEOUS) ×3 IMPLANT
TOWEL OR 17X26 10 PK STRL BLUE (TOWEL DISPOSABLE) IMPLANT
TOWEL OR NON WOVEN STRL DISP B (DISPOSABLE) ×3 IMPLANT
TRAY FOLEY W/METER SILVER 16FR (SET/KITS/TRAYS/PACK) ×3 IMPLANT
TROCAR ADV FIXATION 5X100MM (TROCAR) ×3 IMPLANT
TUBING CONNECTING 10 (TUBING) IMPLANT
TUBING CONNECTING 10' (TUBING)
TUBING INSUFFLATION 10FT LAP (TUBING) IMPLANT

## 2017-06-08 NOTE — H&P (Signed)
William Adkins 04/13/2017 11:24 AM Location: [facility] Patient #: [id number] DOB: May 10, 1966 Married / Language: Cleophus Molt / Race: White Male  Patient Care Team: Scifres, Durel Salts as PCP - General (Physician Assistant) Michael Boston, MD as Consulting Physician (General Surgery) Wilford Corner, MD as Consulting Physician (Gastroenterology)    History of Present Illness   The patient is a 51 year old male who presents with a colonic mass.  ` ` Patient sent for surgical consultation at the request of Dr. Wilford Corner. [facility] gastroenterology. Concern for large sigmoid colon mass. At least a polyp, probably a cancer.  Active 51 year old male. Noticed changes in his bowels. More bloating and gassiness. More loose bowel movements with occasional blood. Concerned him. He went to his primary care office. Sent to Dr. Michail Sermon with [facility] gastroenterology. Dr. Michail Sermon did a colonoscopy and found a circumferential bulky mass 5 cm long, about 40 cm from the anal verge. Biopsy showed tubovillous adenoma. Suspicion for cancer higher given the large size and bowel changes. Surgical consultation recommended for surgical resection since too large to remove endoscopically. Patient notes he's moving his bowels 5 or 6 times a day. Occasional blood. A lot of bloating. History of reflux usually controlled with Tagamet. Some hypertension usually controlled medicines. His monitor intense physical activity. Can walk half hour easily difficulty. He's never had abdominal surgery.  No personal nor family history of inflammatory bowel disease, irritable bowel syndrome, allergy such as Celiac Sprue, dietary/dairy problems, colitis, ulcers nor gastritis. No recent sick contacts/gastroenteritis. No travel outside the country. No changes in diet. No dysphagia to solids or liquids. No hematemesis nor coffee ground emesis. No evidence of prior gastric/peptic  ulceration.   Past Surgical History (Janette Ranson, CMA; 04/13/2017 11:24 AM) Colon Polyp Removal - Colonoscopy  Shoulder Surgery  Right.  Diagnostic Studies History (Janette Ranson, CMA; 04/13/2017 11:24 AM) Colonoscopy  within last year  Allergies (Janette Ranson, CMA; 04/13/2017 11:25 AM) No Known Drug Allergies 04/13/2017 Allergies Reconciled   Medication History (Janette Ranson, CMA; 04/13/2017 11:25 AM) AmLODIPine Besylate (10MG  Tablet, Oral) Active. Valsartan (320MG  Tablet, Oral) Active. Medications Reconciled  Social History (Janette Ranson, CMA; 04/13/2017 11:24 AM) Alcohol use  Occasional alcohol use. Caffeine use  Carbonated beverages, Coffee. Illicit drug use  Prefer to discuss with provider. Tobacco use  Current some day smoker.  Family History (Janette Ranson, CMA; 04/13/2017 11:24 AM) Arthritis  Father, Mother. Breast Cancer  Family Members In General. Colon Cancer  Family Members In General. Colon Polyps  Father. Diabetes Mellitus  Brother, Father, Mother. Heart Disease  Brother, Father. Heart disease in male family member before age 65  Hypertension  Brother, Father.  Other Problems (Janette Ranson, CMA; 04/13/2017 11:24 AM) Back Pain  Gastroesophageal Reflux Disease  Hemorrhoids  High blood pressure     Review of Systems (Janette Ranson CMA; 04/13/2017 11:24 AM) General Present- Appetite Loss and Fatigue. Not Present- Chills, Fever, Night Sweats, Weight Gain and Weight Loss. Skin Present- Dryness. Not Present- Change in Wart/Mole, Hives, Jaundice, New Lesions, Non-Healing Wounds, Rash and Ulcer. HEENT Present- Seasonal Allergies and Wears glasses/contact lenses. Not Present- Earache, Hearing Loss, Hoarseness, Nose Bleed, Oral Ulcers, Ringing in the Ears, Sinus Pain, Sore Throat, Visual Disturbances and Yellow Eyes. Respiratory Present- Snoring. Not Present- Bloody sputum, Chronic Cough, Difficulty Breathing and  Wheezing. Cardiovascular Not Present- Chest Pain, Difficulty Breathing Lying Down, Leg Cramps, Palpitations, Rapid Heart Rate, Shortness of Breath and Swelling of Extremities. Gastrointestinal Present- Chronic diarrhea, Excessive gas, Hemorrhoids and  Indigestion. Not Present- Abdominal Pain, Bloating, Bloody Stool, Change in Bowel Habits, Constipation, Difficulty Swallowing, Gets full quickly at meals, Nausea, Rectal Pain and Vomiting. Male Genitourinary Not Present- Blood in Urine, Change in Urinary Stream, Frequency, Impotence, Nocturia, Painful Urination, Urgency and Urine Leakage. Musculoskeletal Present- Back Pain and Joint Pain. Not Present- Joint Stiffness, Muscle Pain, Muscle Weakness and Swelling of Extremities. Neurological Present- Headaches and Tingling. Not Present- Decreased Memory, Fainting, Numbness, Seizures, Tremor, Trouble walking and Weakness. Psychiatric Present- Fearful. Not Present- Anxiety, Bipolar, Change in Sleep Pattern, Depression and Frequent crying. Endocrine Not Present- Cold Intolerance, Excessive Hunger, Hair Changes, Heat Intolerance, Hot flashes and New Diabetes. Hematology Not Present- Blood Thinners, Easy Bruising, Excessive bleeding, Gland problems, HIV and Persistent Infections.  Vitals (Janette Ranson CMA; 04/13/2017 11:26 AM) 04/13/2017 11:25 AM Weight: 276.6 lb Height: 76in Body Surface Area: 2.54 m Body Mass Index: 33.67 kg/m  Temp.: 98.48F  Pulse: 80 (Regular)  BP: 140/88 (Sitting, Left Arm, Standard)    BP (!) 128/91   Pulse 80   Temp 98.7 F (37.1 C) (Oral)   Resp 18   SpO2 97%     Physical Exam [name] MD; 04/13/2017 11:49 AM) General Mental Status-Alert. General Appearance-Not in acute distress, Not Sickly. Orientation-Oriented X3. Hydration-Well hydrated. Voice-Normal.  Integumentary Global Assessment Upon inspection and palpation of skin surfaces of the - Axillae: non-tender, no inflammation or  ulceration, no drainage. and Distribution of scalp and body hair is normal. General Characteristics Temperature - normal warmth is noted.  Head and Neck Head-normocephalic, atraumatic with no lesions or palpable masses. Face Global Assessment - atraumatic, no absence of expression. Neck Global Assessment - no abnormal movements, no bruit auscultated on the right, no bruit auscultated on the left, no decreased range of motion, non-tender. Trachea-midline. Thyroid Gland Characteristics - non-tender.  Eye Eyeball - Left-Extraocular movements intact, No Nystagmus. Eyeball - Right-Extraocular movements intact, No Nystagmus. Cornea - Left-No Hazy. Cornea - Right-No Hazy. Sclera/Conjunctiva - Left-No scleral icterus, No Discharge. Sclera/Conjunctiva - Right-No scleral icterus, No Discharge. Pupil - Left-Direct reaction to light normal. Pupil - Right-Direct reaction to light normal. Note: Wears glasses. Vision corrected   ENMT Ears Pinna - Left - no drainage observed, no generalized tenderness observed. Right - no drainage observed, no generalized tenderness observed. Nose and Sinuses External Inspection of the Nose - no destructive lesion observed. Inspection of the nares - Left - quiet respiration. Right - quiet respiration. Mouth and Throat Lips - Upper Lip - no fissures observed, no pallor noted. Lower Lip - no fissures observed, no pallor noted. Nasopharynx - no discharge present. Oral Cavity/Oropharynx - Tongue - no dryness observed. Oral Mucosa - no cyanosis observed. Hypopharynx - no evidence of airway distress observed.  Chest and Lung Exam Inspection Movements - Normal and Symmetrical. Accessory muscles - No use of accessory muscles in breathing. Palpation Palpation of the chest reveals - Non-tender. Auscultation Breath sounds - Normal and Clear.  Cardiovascular Auscultation Rhythm - Regular. Murmurs & Other Heart Sounds - Auscultation of the heart  reveals - No Murmurs and No Systolic Clicks.  Abdomen Inspection Inspection of the abdomen reveals - No Visible peristalsis and No Abnormal pulsations. Umbilicus - No Bleeding, No Urine drainage. Palpation/Percussion Palpation and Percussion of the abdomen reveal - Soft, Non Tender, No Rebound tenderness, No Rigidity (guarding) and No Cutaneous hyperesthesia.  Note: Abdomen soft. Nontender, nondistended. No guarding. No diastasis. No umbilical nor other hernias   Male Genitourinary Sexual Maturity Tanner 5 -  Adult hair pattern and Adult penile size and shape. Note: No inguinal hernias. No lymphadenopathy.   Rectal Note: Deferred given recent colonoscopy   Peripheral Vascular Upper Extremity Inspection - Left - No Cyanotic nailbeds, Not Ischemic. Right - No Cyanotic nailbeds, Not Ischemic.  Neurologic Neurologic evaluation reveals -normal attention span and ability to concentrate, able to name objects and repeat phrases. Appropriate fund of knowledge , normal sensation and normal coordination. Mental Status Affect - not angry, not paranoid. Cranial Nerves-Normal Bilaterally. Gait-Normal.  Neuropsychiatric Mental status exam performed with findings of-able to articulate well with normal speech/language, rate, volume and coherence, thought content normal with ability to perform basic computations and apply abstract reasoning and no evidence of hallucinations, delusions, obsessions or homicidal/suicidal ideation.  Musculoskeletal Global Assessment Spine, Ribs and Pelvis - no instability, subluxation or laxity. Right Upper Extremity - no instability, subluxation or laxity.  Lymphatic Head & Neck  General Head & Neck Lymphatics: Bilateral - Description - No Localized lymphadenopathy. Axillary  General Axillary Region: Bilateral - Description - No Localized lymphadenopathy. Femoral & Inguinal  Generalized Femoral & Inguinal Lymphatics: Left - Description - No  Localized lymphadenopathy. Right - Description - No Localized lymphadenopathy.    Assessment & Plan  ADENOMATOUS POLYP OF DESCENDING COLON Impression: Large circumferential mass mass of descending colon, ~45cm from anal verge. Biopsy showed tubular villous adenoma. No high-grade dysplasia. However suspicion very high of tumor given its large size and patient's change in bowel habits.  I think he would benefit from segmental colonic resection. He is anxious to get it done as soon as possible. I believe he was stressed that he had a death sentence. All I cautioned that I suspect he does have a cancer, focused on being optimistic that were catching this early. He agrees with the plan and is more hopeful.  The anatomy & physiology of the digestive tract was discussed.  The pathophysiology of the colon was discussed.  Natural history risks without surgery was discussed.   I feel the risks of no intervention will lead to serious problems that outweigh the operative risks; therefore, I recommended a partial colectomy to remove the pathology.  Minimally invasive (Robotic/Laparoscopic) & open techniques were discussed.   Risks such as bleeding, infection, abscess, leak, reoperation, injury to other organs, need for repair of tissues / organs, possible ostomy, hernia, heart attack, stroke, death, and other risks were discussed.  I noted a good likelihood this will help address the problem.   Goals of post-operative recovery were discussed as well.   Need for adequate nutrition, daily bowel regimen and healthy physical activity, to optimize recovery was noted as well. We will work to minimize complications.  Educational materials were available as well.  Questions were answered.  The patient expresses understanding & wishes to proceed with surgery.    PREOP COLON - ENCOUNTER FOR PREOPERATIVE EXAMINATION FOR GENERAL SURGICAL PROCEDURE (Z01.818) Current Plans You are being scheduled for surgery- Our schedulers  will call you.  You should hear from our office's scheduling department within 5 working days about the location, date, and time of surgery. We try to make accommodations for patient's preferences in scheduling surgery, but sometimes the OR schedule or the surgeon's schedule prevents [place] from making those accommodations.  If you have not heard from our office [phone]) in 5 working days, call the office and ask for your surgeon's nurse.  If you have other questions about your diagnosis, plan, or surgery, call the office and ask for your  surgeon's nurse.  Written instructions provided Pt Education - Pamphlet Given - Laparoscopic Colorectal Surgery: discussed with patient and provided information. Pt Education - [facility] Colon Bowel Prep 2015 Miralax/Antibiotics Started Neomycin Sulfate 500MG , 2 (two) Tablet SEE NOTE, #6, 04/13/2017, No Refill. Local Order: TAKE TWO TABLETS AT 2 PM, 3 PM, AND 10 PM THE DAY PRIOR TO SURGERY Started Flagyl 500MG , 2 (two) Tablet SEE NOTE, #6, 04/13/2017, No Refill. Local Order: Take at 2pm, 3pm, and 10pm the day prior to your colon operation The anatomy & physiology of the digestive tract was discussed. The pathophysiology of the colon was discussed. Natural history risks without surgery was discussed. I feel the risks of no intervention will lead to serious problems that outweigh the operative risks; therefore, I recommended a partial colectomy to remove the pathology. Minimally invasive (Robotic/Laparoscopic) & open techniques were discussed.  Risks such as bleeding, infection, abscess, leak, reoperation, possible ostomy, hernia, heart attack, death, and other risks were discussed. I noted a good likelihood this will help address the problem. Goals of post-operative recovery were discussed as well. Need for adequate nutrition, daily bowel regimen and healthy physical activity, to optimize recovery was noted as well. We will work to minimize complications.  Educational materials were available as well. Questions were answered. The patient expresses understanding & wishes to proceed with surgery.  Pt Education - [facility] Colectomy post-op instructions: discussed with patient and provided information. TOBACCO ABUSE (Z72.0) Impression: Recommend he stop chewing/dipping tobacco. Current Plans Pt Education - [facility] STOP SMOKING!   I have re-reviewed the the patient's records, history, medications, and allergies.  I have re-examined the patient.  I again discussed intraoperative plans and goals of post-operative recovery.  The patient agrees to proceed.  William Adkins  07/22/1966 [id number]  Patient Care Team: Scifres, Durel Salts as PCP - General (Physician Assistant) Michael Boston, MD as Consulting Physician (General Surgery) Wilford Corner, MD as Consulting Physician (Gastroenterology)  Patient Active Problem List   Diagnosis Date Noted  . 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 Headache   . Hypertension   . Pre-diabetes     Past Surgical History:  Procedure Laterality Date  . colonoscopy    . SHOULDER SURGERY     right shoulder states "it would disclocate a lot; had to stretch tendon  and staple it to the top"    Social History   Social History  . Marital status: Married    Spouse name: N/A  . Number of children: N/A  . Years of education: N/A   Occupational History  . Not on file.   Social History Main Topics  . Smoking status: Former Smoker    Types: Cigarettes    Quit date: 06/01/2014  . Smokeless tobacco: Current User    Types: Chew  . Alcohol use Yes     Comment: occasionally   . Drug use: No  . Sexual activity: Yes   Other Topics Concern  . Not on file   Social History Narrative  . No narrative on file    History reviewed. No pertinent family history.  Current Facility-Administered Medications  Medication Dose Route Frequency  Provider Last Rate Last Dose  . bupivacaine liposome (EXPAREL) 1.3 % injection 266 mg  20 mL Infiltration On Call to OR Michael Boston, MD      . cefoTEtan (CEFOTAN) 2 g in dextrose 5 % 50 mL IVPB  2 g Intravenous On Call  to OR Michael Boston, MD      . clindamycin (CLEOCIN) 900 mg, gentamicin (GARAMYCIN) 240 mg in sodium chloride 0.9 % 1,000 mL for intraperitoneal lavage   Intraperitoneal To OR Michael Boston, MD      . lactated ringers infusion   Intravenous Continuous Myrtie Soman, MD 10 mL/hr at 06/08/17 1118       No Known Allergies  BP (!) 128/91   Pulse 80   Temp 98.7 F (37.1 C) (Oral)   Resp 18   SpO2 97%   Labs: No results found for this or any previous visit (from the past 48 hour(s)).  Imaging / Studies: No results found.   [name], M.D., F.A.C.S. Gastrointestinal and Minimally Invasive Surgery [facility] [address] [place], [place] [address] [phone] Main / Paging  06/08/2017 11:43 AM

## 2017-06-08 NOTE — Anesthesia Postprocedure Evaluation (Signed)
Anesthesia Post Note  Patient: William Adkins  Procedure(s) Performed: Procedure(s) (LRB): XI ROBOT DISTAL SIGMOID  COLECTOMY ERAS PATHWAY (N/A)     Patient location during evaluation: PACU Anesthesia Type: General Level of consciousness: awake and alert Pain management: pain level controlled Vital Signs Assessment: post-procedure vital signs reviewed and stable Respiratory status: spontaneous breathing, nonlabored ventilation, respiratory function stable and patient connected to nasal cannula oxygen Cardiovascular status: blood pressure returned to baseline and stable Postop Assessment: no signs of nausea or vomiting Anesthetic complications: no    Last Vitals:  Vitals:   06/08/17 1630 06/08/17 1635  BP: 140/86   Pulse: 75 80  Resp: 12 14  Temp:      Last Pain:  Vitals:   06/08/17 1635  TempSrc:   PainSc: 6                  William Adkins S

## 2017-06-08 NOTE — Progress Notes (Signed)
PHARMACIST - PHYSICIAN ORDER COMMUNICATION  CONCERNING: P&T Medication Policy on Herbal Medications  DESCRIPTION:  This patient's order for:  Caffeine tablets  has been noted.  This product(s) is classified as an "herbal" or natural product. Due to a lack of definitive safety studies or FDA approval, nonstandard manufacturing practices, plus the potential risk of unknown drug-drug interactions while on inpatient medications, the Pharmacy and Therapeutics Committee does not permit the use of "herbal" or natural products of this type within [facility].   ACTION TAKEN: The pharmacy department is unable to verify this order at this time and your patient has been informed of this safety policy. Please reevaluate patient's clinical condition at discharge and address if the herbal or natural product(s) should be resumed at that time.  Dia Sitter, PharmD, BCPS 06/08/2017 5:46 PM

## 2017-06-08 NOTE — Anesthesia Preprocedure Evaluation (Signed)
Anesthesia Evaluation  Patient identified by MRN, date of birth, ID band Patient awake    Reviewed: Allergy & Precautions, NPO status , Patient's Chart, lab work & pertinent test results  Airway Mallampati: II  TM Distance: >3 FB Neck ROM: Full    Dental no notable dental hx.    Pulmonary neg pulmonary ROS, former smoker,    Pulmonary exam normal breath sounds clear to auscultation       Cardiovascular hypertension, Normal cardiovascular exam Rhythm:Regular Rate:Normal     Neuro/Psych negative neurological ROS  negative psych ROS   GI/Hepatic negative GI ROS, Neg liver ROS,   Endo/Other  negative endocrine ROS  Renal/GU negative Renal ROS  negative genitourinary   Musculoskeletal negative musculoskeletal ROS (+)   Abdominal   Peds negative pediatric ROS (+)  Hematology negative hematology ROS (+)   Anesthesia Other Findings   Reproductive/Obstetrics negative OB ROS                             Anesthesia Physical Anesthesia Plan  ASA: II  Anesthesia Plan: General   Post-op Pain Management:    Induction: Intravenous  PONV Risk Score and Plan: 1 and Ondansetron and Dexamethasone  Airway Management Planned: Oral ETT  Additional Equipment:   Intra-op Plan:   Post-operative Plan: Extubation in OR  Informed Consent: I have reviewed the patients History and Physical, chart, labs and discussed the procedure including the risks, benefits and alternatives for the proposed anesthesia with the patient or authorized representative who has indicated his/her understanding and acceptance.   Dental advisory given  Plan Discussed with: CRNA and Surgeon  Anesthesia Plan Comments:         Anesthesia Quick Evaluation

## 2017-06-08 NOTE — Transfer of Care (Signed)
Immediate Anesthesia Transfer of Care Note  Patient: William Adkins  Procedure(s) Performed: Procedure(s): XI ROBOT DISTAL SIGMOID  COLECTOMY ERAS PATHWAY (N/A)  Patient Location: PACU  Anesthesia Type:General  Level of Consciousness:  sedated, patient cooperative and responds to stimulation  Airway & Oxygen Therapy:Patient Spontanous Breathing and Patient connected to face mask oxgen  Post-op Assessment:  Report given to PACU RN and Post -op Vital signs reviewed and stable  Post vital signs:  Reviewed and stable  Last Vitals:  Vitals:   06/08/17 1055  BP: (!) 128/91  Pulse: 80  Resp: 18  Temp: 59.2 C    Complications: No apparent anesthesia complications

## 2017-06-08 NOTE — Anesthesia Procedure Notes (Addendum)
Procedure Name: Intubation Date/Time: 06/08/2017 12:28 PM Performed by: Claudia Desanctis Pre-anesthesia Checklist: Patient identified, Emergency Drugs available, Suction available and Patient being monitored Patient Re-evaluated:Patient Re-evaluated prior to inductionOxygen Delivery Method: Circle system utilized Preoxygenation: Pre-oxygenation with 100% oxygen Intubation Type: IV induction Ventilation: Mask ventilation without difficulty and Two handed mask ventilation required Laryngoscope Size: 2 and Miller Grade View: Grade I Tube type: Oral Tube size: 8.0 mm Number of attempts: 1 Airway Equipment and Method: Stylet Placement Confirmation: ETT inserted through vocal cords under direct vision,  positive ETCO2 and breath sounds checked- equal and bilateral Secured at: 24 cm Tube secured with: Tape Dental Injury: Teeth and Oropharynx as per pre-operative assessment  Comments: Intubation performed by Julianne Rice, SRNA.

## 2017-06-08 NOTE — Interval H&P Note (Signed)
History and Physical Interval Note:  06/08/2017 11:45 AM  William Adkins  has presented today for surgery, with the diagnosis of Mass of sigmoid colon polyp probable cancer  The various methods of treatment have been discussed with the patient and family. After consideration of risks, benefits and other options for treatment, the patient has consented to  Procedure(s): XI [facility] (N/A) as a surgical intervention .  The patient's history has been reviewed, patient examined, no change in status, stable for surgery.  I have reviewed the patient's chart and labs.  Questions were answered to the patient's satisfaction.     [name]

## 2017-06-08 NOTE — Op Note (Signed)
06/08/2017  4:04 PM  PATIENT:  William Adkins  51 y.o. male  Patient Care Team: Scifres, Durel Salts as PCP - General (Physician Assistant) Michael Boston, MD as Consulting Physician (General Surgery) Wilford Corner, MD as Consulting Physician (Gastroenterology)  PRE-OPERATIVE DIAGNOSIS:  Mass of descending colon (polyp, possible cancer)  POST-OPERATIVE DIAGNOSIS:  PROXIMAL DECENDING COLON MASSPOLYP  PROCEDURE:   XI ROBOTIC SPLENIC FLEXURE MOBILIZATION LEFT COLECTOMY   SURGEON:  [name], MD  ASSISTANT: Leighton Ruff, MD, FACS.   ANESTHESIA:   local and general  EBL:  Total I/O In: 1000 [I.V.:1000] Out: 120 [Urine:70; Blood:50]  Delay start of Pharmacological VTE agent (>24hrs) due to surgical blood loss or risk of bleeding:  no  DRAINS: none   SPECIMEN:  Source of Specimen:  DISTAL "LEFT" COLON (open end proximal).  Proximal anastomotic ring  DISPOSITION OF SPECIMEN:  PATHOLOGY  COUNTS:  YES  PLAN OF CARE: Admit to inpatient   PATIENT DISPOSITION:  PACU - hemodynamically stable.  INDICATION:    Patient with bulky polypoid mass in left colon about 45 cm from anal verge.  At least adenomatous polyp.  Felt to be too large to remove.  Concern for cancer.  Surgical consultation requested.  I recommended segmental resection:  The anatomy & physiology of the digestive tract was discussed.  The pathophysiology was discussed.  Natural history risks without surgery was discussed.   I worked to give an overview of the disease and the frequent need to have multispecialty involvement.  I feel the risks of no intervention will lead to serious problems that outweigh the operative risks; therefore, I recommended a partial colectomy to remove the pathology.  Laparoscopic & open techniques were discussed.   Risks such as bleeding, infection, abscess, leak, reoperation, possible ostomy, hernia, heart attack, death, and other risks were discussed.  I noted a good likelihood  this will help address the problem.   Goals of post-operative recovery were discussed as well.  We will work to minimize complications.  Educational materials on the pathology had been given in the office.  Questions were answered.    The patient expressed understanding & wished to proceed with surgery.  OR FINDINGS:   Patient had a bulky but soft mass several centimeters long in the proximal descending colon does not distal to the splenic flexure.  No obvious metastatic disease on visceral parietal peritoneum or liver.  The anastomosis rests 16 cm from the anal verge by rigid proctoscopy.  It is an end mid transverse colon to side rectal EEA 33 stapled anastomosis  DESCRIPTION:   Informed consent was confirmed.  The patient underwent general anaesthesia without difficulty.  The patient was positioned appropriately.  VTE prevention in place.  The patient's abdomen was clipped, prepped, & draped in a sterile fashion.  Surgical timeout confirmed our plan.  The patient was positioned in reverse Trendelenburg.  Abdominal entry was gained using Varess technique with a trach hook on the anterior abdominal wall fascia in the right upper abdomen.  Entry was clean.  I induced carbon dioxide insufflation.  Camera inspection revealed no injury.  Extra ports were carefully placed under direct laparoscopic visualization.  I reflected the greater omentum and the upper abdomen the small bowel in the upper abdomen.  The patient was carefully positioned.  The Intuitive daVinci robot was carefully docked with camera & instruments carefully placed.  The patient had no obvious tattoo in the descending or sigmoid colon.  After exploration and mobilization was able  to find the tattoo in the proximal descending colon just distal to the splenic flexure.  I decided to focus on some sponge flexure mobilization.  The greater omentum off the mid transverse colon and followed it distally towards the splenic flexure.  Lesser  sac was somewhat small and obliterated but eventually freed that off.  Then mobilized the left colon somewhat lateral to medial.  I freed greater omentum off the distal descending colon and splenic flexure to better identify and mobilize the splenic flexure superficially in the superior to inferior lateral medial fashion.    I scored the base of peritoneum of the medial side of the mesentery of the left colon from the ligament of Treitz to the peritoneal reflection of the mid rectum.   I elevated the sigmoid mesentery and entered into the retro-mesenteric plane. We were able to identify the left ureter and gonadal vessels. We kept those posterior within the retroperitoneum and elevated the left colon mesentery off that. I did isolate the inferior mesenteric artery (IMA) pedicle but did not ligate it yet.  I continued distally and got into the avascular plane posterior to the mesorectum. This allowed me to help mobilize the rectum as well by freeing the mesorectum off the sacrum.  I mobilized the peritoneal coverings towards the peritoneal reflection on both the right and left sides of the rectum.  I stayed away from the right and left ureters.  I kept the lateral vascular pedicles to the rectum intact.  Isolate and skeletonize the left colic pedicle and transected it.  For mobilize the entire splenic flexure and distal transverse colon off the retroperitoneum including the liver up towards the inferior pancreatic ridge.  It is not much descending or sigmoid colon to the rectosigmoid junction so we decided to take that as well.  I skeletonized the lymph nodes off the inferior mesenteric artery pedicle.  I went down to its takeoff from the aorta.  I isolated the inferior mesenteric vein off of the ligament of Treitz just cephalad to that as well.  After confirming the left ureter was out of the way, I went ahead and ligated the inferior mesenteric artery pedicle just near its takeoff from the aorta.  I did ligate  the inferior mesenteric vein in a similar fashion.  We ensured hemostasis. I skeletonized the mesorectum at the junction at the rectosigmoid for the distal point of resection.   I did further mobilization to lift the left colon mesentery anteriorly.  Found the left middle colic artery.  I entered in the colon mesentery just distal that and transected the left colon mesentery of the distal transverse colon and splenic flexure off the retroperitoneum and pedicle until I connected with the left colic mesenteric transection window.  This allowed the left colon to completely reach down into the pelvis.  I skeletonized at the proximal mesorectum and transected at the proximal rectum using a robotic 45 mm stapler.  I chose a region at the  distal transverse colon easily reached down to the rectal stump.    I created an extraction incision through a small Pfannenstiel incision in the suprapubic region.  Placed a wound protector.  I was able to eviscerate the entire left colon to the mid transverse colon out the Pfannenstiel incisional wound.   I clamped the colon proximal to this area using a reusable pursestringer device.  Passed a 2-0 Keith needle. I transected at the descending/sigmoid junction with a scalpel. I got healthy bleeding mucosa.  We  sent the rectosigmoid colon specimen off to go to pathology.  We sized the colon orifice.  I chose a 33 EEA anvil stapler system.  I reinforced the prolene pursestring with interrupted silk suture.  I placed the anvil to the open end of the proximal remaining colon and closed around it using the pursestring.    We did copious irrigation with crystalloid solution.  Hemostasis was good.  The distal end of the remaining colon easily reached down to the rectal stump, therefore,  further transverse colon mobilization was not needed.      Dr Marcello Moores scrubbed down and did gentle anal dilation and advanced the EEA stapler up the rectal stump.  He cannot come out the end staple line  so therefore we brought it out the anterior rectal wall 5 cm proximal to the staple line under direct visualization.  I attached the anvil of the proximal colon the spike of the stapler.  I confirmed there is no small bowel trapped posterior to the left colon mesentery.  There was no twisting of the mesentery.  It laid nice and straight.  Anvil was tightened down and held clamped for 60 seconds. The EEA stapler was fired and held clamped for 30 seconds. The stapler was released & removed. We noted 2 excellent anastomotic rings. Blue stitch is in the proximal ring.  Dr Marcello Moores did rigid proctoscopy noted the anastomosis was at ~16 cm from the anal verge consistent with the proximal rectum.  We did a final irrigation of antibiotic solution (900 mg clindamycin/240 mg gentamicin in a liter of crystalloid) & held that for the pelvic air leak test .  The rectum was insufflated the rectum while clamping the colon proximal to that anastomosis.  There was a negative air leak test. There was no tension of mesentery or bowel at the anastomosis.   Tissues looked viable.  Ureters & bowel uninjured.  The anastomosis looked healthy.  I closed the 12 mm stapler port site with a 0 Vicryl using a laparoscopic suture passer under direct visualization.  Endoluminal gas was evacuated.  Ports & wound protector removed.  We changed gloves & redraped the patient per colon SSI prevention protocol.  We aspirated the antibiotic irrigation.  Hemostasis was good.  Sterile unused instruments were used from this point.  I closed the skin at the port sites using Monocryl stitch and sterile dressing.  I closed the extraction wound using a 0 Vicryl vertical peritoneal closure and a #1 PDS transverse anterior rectal fascial closure like a small Pfannenstiel closure. I closed the skin with some interrupted Monocryl stitches. I placed antibiotic-soaked wicks into the closure at the corners x2.  I placed sterile dressings.     Patient is being  extubated go to recovery room. I had discussed postop care with the patient in detail the office & in the holding area. Instructions are written.  I'm about to locate family and discuss it with them as well.  [name], M.D., F.A.C.S. Gastrointestinal and Minimally Invasive Surgery [facility] [address] [place], [place] [address] [phone] Main / Paging

## 2017-06-08 NOTE — Discharge Instructions (Signed)
SURGERY: POST OP INSTRUCTIONS °(Surgery for small bowel obstruction, colon resection, etc) ° ° °###################################################################### ° °EAT °Gradually transition to a high fiber diet with a fiber supplement over the next few days after discharge ° °WALK °Walk an hour a day.  Control your pain to do that.   ° °CONTROL PAIN °Control pain so that you can walk, sleep, tolerate sneezing/coughing, go up/down stairs. ° °HAVE A BOWEL MOVEMENT DAILY °Keep your bowels regular to avoid problems.  OK to try a laxative to override constipation.  OK to use an antidairrheal to slow down diarrhea.  Call if not better after 2 tries ° °CALL IF YOU HAVE PROBLEMS/CONCERNS °Call if you are still struggling despite following these instructions. °Call if you have concerns not answered by these instructions ° °###################################################################### ° ° °DIET °Follow a light diet the first few days at home.  Start with a bland diet such as soups, liquids, starchy foods, low fat foods, etc.  If you feel full, bloated, or constipated, stay on a ful liquid or pureed/blenderized diet for a few days until you feel better and no longer constipated. °Be sure to drink plenty of fluids every day to avoid getting dehydrated (feeling dizzy, not urinating, etc.). °Gradually add a fiber supplement to your diet over the next week.  Gradually get back to a regular solid diet.  Avoid fast food or heavy meals the first week as you are more likely to get nauseated. °It is expected for your digestive tract to need a few months to get back to normal.  It is common for your bowel movements and stools to be irregular.  You will have occasional bloating and cramping that should eventually fade away.  Until you are eating solid food normally, off all pain medications, and back to regular activities; your bowels will not be normal. °Focus on eating a low-fat, high fiber diet the rest of your life  (See Getting to Good Bowel Health, below). ° °CARE of your INCISION or WOUND °It is good for closed incision and even open wounds to be washed every day.  Shower every day.  Short baths are fine.  Wash the incisions and wounds clean with soap & water.    °If you have a closed incision(s), wash the incision with soap & water every day.  You may leave closed incisions open to air if it is dry.   You may cover the incision with clean gauze & replace it after your daily shower for comfort. °If you have skin tapes (Steristrips) or skin glue (Dermabond) on your incision, leave them in place.  They will fall off on their own like a scab.  You may trim any edges that curl up with clean scissors.  If you have staples, set up an appointment for them to be removed in the office in 10 days after surgery.  °If you have a drain, wash around the skin exit site with soap & water and place a new dressing of gauze or band aid around the skin every day.  Keep the drain site clean & dry.    °If you have an open wound with packing, see wound care instructions.  In general, it is encouraged that you remove your dressing and packing, shower with soap & water, and replace your dressing once a day.  Pack the wound with clean gauze moistened with normal (0.9%) saline to keep the wound moist & uninfected.  Pressure on the dressing for 30 minutes will stop most wound   bleeding.  Eventually your body will heal & pull the open wound closed over the next few months.  °Raw open wounds will occasionally bleed or secrete yellow drainage until it heals closed.  Drain sites will drain a little until the drain is removed.  Even closed incisions can have mild bleeding or drainage the first few days until the skin edges scab over & seal.   °If you have an open wound with a wound vac, see wound vac care instructions. ° ° ° ° °ACTIVITIES as tolerated °Start light daily activities --- self-care, walking, climbing stairs-- beginning the day after surgery.   Gradually increase activities as tolerated.  Control your pain to be active.  Stop when you are tired.  Ideally, walk several times a day, eventually an hour a day.   °Most people are back to most day-to-day activities in a few weeks.  It takes 4-8 weeks to get back to unrestricted, intense activity. °If you can walk 30 minutes without difficulty, it is safe to try more intense activity such as jogging, treadmill, bicycling, low-impact aerobics, swimming, etc. °Save the most intensive and strenuous activity for last (Usually 4-8 weeks after surgery) such as sit-ups, heavy lifting, contact sports, etc.  Refrain from any intense heavy lifting or straining until you are off narcotics for pain control.  You will have off days, but things should improve week-by-week. °DO NOT PUSH THROUGH PAIN.  Let pain be your guide: If it hurts to do something, don't do it.  Pain is your body warning you to avoid that activity for another week until the pain goes down. °You may drive when you are no longer taking narcotic prescription pain medication, you can comfortably wear a seatbelt, and you can safely make sudden turns/stops to protect yourself without hesitating due to pain. °You may have sexual intercourse when it is comfortable. If it hurts to do something, stop. ° °MEDICATIONS °Take your usually prescribed home medications unless otherwise directed.   °Blood thinners:  °Usually you can restart any strong blood thinners after the second postoperative day.  It is OK to take aspirin right away.    ° If you are on strong blood thinners (warfarin/Coumadin, Plavix, Xerelto, Eliquis, Pradaxa, etc), discuss with your surgeon, medicine PCP, and/or cardiologist for instructions on when to restart the blood thinner & if blood monitoring is needed (PT/INR blood check, etc).   ° ° °PAIN CONTROL °Pain after surgery or related to activity is often due to strain/injury to muscle, tendon, nerves and/or incisions.  This pain is usually  short-term and will improve in a few months.  °To help speed the process of healing and to get back to regular activity more quickly, DO THE FOLLOWING THINGS TOGETHER: °1. Increase activity gradually.  DO NOT PUSH THROUGH PAIN °2. Use Ice and/or Heat °3. Try Gentle Massage and/or Stretching °4. Take over the counter pain medication °5. Take Narcotic prescription pain medication for more severe pain ° °Good pain control = faster recovery.  It is better to take more medicine to be more active than to stay in bed all day to avoid medications. °1.  Increase activity gradually °Avoid heavy lifting at first, then increase to lifting as tolerated over the next 6 weeks. °Do not “push through” the pain.  Listen to your body and avoid positions and maneuvers than reproduce the pain.  Wait a few days before trying something more intense °Walking an hour a day is encouraged to help your body recover faster   and more safely.  Start slowly and stop when getting sore.  If you can walk 30 minutes without stopping or pain, you can try more intense activity (running, jogging, aerobics, cycling, swimming, treadmill, sex, sports, weightlifting, etc.) °Remember: If it hurts to do it, then don’t do it! °2. Use Ice and/or Heat °You will have swelling and bruising around the incisions.  This will take several weeks to resolve. °Ice packs or heating pads (6-8 times a day, 30-60 minutes at a time) will help sooth soreness & bruising. °Some people prefer to use ice alone, heat alone, or alternate between ice & heat.  Experiment and see what works best for you.  Consider trying ice for the first few days to help decrease swelling and bruising; then, switch to heat to help relax sore spots and speed recovery. °Shower every day.  Short baths are fine.  It feels good!  Keep the incisions and wounds clean with soap & water.   °3. Try Gentle Massage and/or Stretching °Massage at the area of pain many times a day °Stop if you feel pain - do not  overdo it °4. Take over the counter pain medication °This helps the muscle and nerve tissues become less irritable and calm down faster °Choose ONE of the following over-the-counter anti-inflammatory medications: °Acetaminophen 500mg tabs (Tylenol) 1-2 pills with every meal and just before bedtime (avoid if you have liver problems or if you have acetaminophen in you narcotic prescription) °Naproxen 220mg tabs (ex. Aleve, Naprosyn) 1-2 pills twice a day (avoid if you have kidney, stomach, IBD, or bleeding problems) °Ibuprofen 200mg tabs (ex. Advil, Motrin) 3-4 pills with every meal and just before bedtime (avoid if you have kidney, stomach, IBD, or bleeding problems) °Take with food/snack several times a day as directed for at least 2 weeks to help keep pain / soreness down & more manageable. °5. Take Narcotic prescription pain medication for more severe pain °A prescription for strong pain control is often given to you upon discharge (for example: oxycodone/Percocet, hydrocodone/Norco/Vicodin, or tramadol/Ultram) °Take your pain medication as prescribed. °Be mindful that most narcotic prescriptions contain Tylenol (acetaminophen) as well - avoid taking too much Tylenol. °If you are having problems/concerns with the prescription medicine (does not control pain, nausea, vomiting, rash, itching, etc.), please call us [phone] to see if we need to switch you to a different pain medicine that will work better for you and/or control your side effects better. °If you need a refill on your pain medication, you must call the office before 4 pm and on weekdays only.  By federal law, prescriptions for narcotics cannot be called into a pharmacy.  They must be filled out on paper & picked up from our office by the patient or authorized caretaker.  Prescriptions cannot be filled after 4 pm nor on weekends.   ° °WHEN TO CALL US [phone] °Severe uncontrolled or worsening pain  °Fever over 101 F (38.5 C) °Concerns with  the incision: Worsening pain, redness, rash/hives, swelling, bleeding, or drainage °Reactions / problems with new medications (itching, rash, hives, nausea, etc.) °Nausea and/or vomiting °Difficulty urinating °Difficulty breathing °Worsening fatigue, dizziness, lightheadedness, blurred vision °Other concerns °If you are not getting better after two weeks or are noticing you are getting worse, contact our office [phone] for further advice.  We may need to adjust your medications, re-evaluate you in the office, send you to the emergency room, or see what other things we can do to help. °The   clinic staff is available to answer your questions during regular business hours (8:30am-5pm).  Please don’t hesitate to call and ask to speak to one of our nurses for clinical concerns.    °A surgeon from [facility] is always on call at the hospitals 24 hours/day °If you have a medical emergency, go to the nearest emergency room or call 911. ° °FOLLOW UP in our office °One the day of your discharge from the hospital (or the next business weekday), please call [facility] to set up or confirm an appointment to see your surgeon in the office for a follow-up appointment.  Usually it is 2-3 weeks after your surgery.   °If you have skin staples at your incision(s), let the office know so we can set up a time in the office for the nurse to remove them (usually around 10 days after surgery). °Make sure that you call for appointments the day of discharge (or the next business weekday) from the hospital to ensure a convenient appointment time. °IF YOU HAVE DISABILITY OR FAMILY LEAVE FORMS, BRING THEM TO THE OFFICE FOR PROCESSING.  DO NOT GIVE THEM TO YOUR DOCTOR. ° °[facility] °[address], [address], [place], [place]  [address] ? °[phone] - Main °[phone] - Toll Free,  [phone] - Fax °[email or web address] ° °GETTING TO GOOD BOWEL HEALTH. °It is  expected for your digestive tract to need a few months to get back to normal.  It is common for your bowel movements and stools to be irregular.  You will have occasional bloating and cramping that should eventually fade away.  Until you are eating solid food normally, off all pain medications, and back to regular activities; your bowels will not be normal.   °Avoiding constipation °The goal: ONE SOFT BOWEL MOVEMENT A DAY!    °Drink plenty of fluids.  Choose water first. °TAKE A FIBER SUPPLEMENT EVERY DAY THE REST OF YOUR LIFE °During your first week back home, gradually add back a fiber supplement every day °Experiment which form you can tolerate.   There are many forms such as powders, tablets, wafers, gummies, etc °Psyllium bran (Metamucil), methylcellulose (Citrucel), Miralax or Glycolax, Benefiber, Flax Seed.  °Adjust the dose week-by-week (1/2 dose/day to 6 doses a day) until you are moving your bowels 1-2 times a day.  Cut back the dose or try a different fiber product if it is giving you problems such as diarrhea or bloating. °Sometimes a laxative is needed to help jump-start bowels if constipated until the fiber supplement can help regulate your bowels.  If you are tolerating eating & you are farting, it is okay to try a gentle laxative such as double dose MiraLax, prune juice, or Milk of Magnesia.  Avoid using laxatives too often. °Stool softeners can sometimes help counteract the constipating effects of narcotic pain medicines.  It can also cause diarrhea, so avoid using for too long. °If you are still constipated despite taking fiber daily, eating solids, and a few doses of laxatives, call our office. °Controlling diarrhea °Try drinking liquids and eating bland foods for a few days to avoid stressing your intestines further. °Avoid dairy products (especially milk & ice cream) for a short time.  The intestines often can lose the ability to digest lactose when stressed. °Avoid foods that cause gassiness or  bloating.  Typical foods include beans and other legumes, cabbage, broccoli, and dairy foods.  Avoid greasy, spicy, fast foods.  Every person has   some sensitivity to other foods, so listen to your body and avoid those foods that trigger problems for you. °Probiotics (such as active yogurt, Align, etc) may help repopulate the intestines and colon with normal bacteria and calm down a sensitive digestive tract °Adding a fiber supplement gradually can help thicken stools by absorbing excess fluid and retrain the intestines to act more normally.  Slowly increase the dose over a few weeks.  Too much fiber too soon can backfire and cause cramping & bloating. °It is okay to try and slow down diarrhea with a few doses of antidiarrheal medicines.   °Bismuth subsalicylate (ex. Kayopectate, Pepto Bismol) for a few doses can help control diarrhea.  Avoid if pregnant.   °Loperamide (Imodium) can slow down diarrhea.  Start with one tablet (2mg) first.  Avoid if you are having fevers or severe pain.  °ILEOSTOMY PATIENTS WILL HAVE CHRONIC DIARRHEA since their colon is not in use.    °Drink plenty of liquids.  You will need to drink even more glasses of water/liquid a day to avoid getting dehydrated. °Record output from your ileostomy.  Expect to empty the bag every 3-4 hours at first.  Most people with a permanent ileostomy empty their bag 4-6 times at the least.   °Use antidiarrheal medicine (especially Imodium) several times a day to avoid getting dehydrated.  Start with a dose at bedtime & breakfast.  Adjust up or down as needed.  Increase antidiarrheal medications as directed to avoid emptying the bag more than 8 times a day (every 3 hours). °Work with your wound ostomy nurse to learn care for your ostomy.  See ostomy care instructions. °TROUBLESHOOTING IRREGULAR BOWELS °1) Start with a soft & bland diet. No spicy, greasy, or fried foods.  °2) Avoid gluten/wheat or dairy products from diet to see if symptoms improve. °3) Miralax  17gm or flax seed mixed in 8oz. water or juice-daily. May use 2-4 times a day as needed. °4) Gas-X, Phazyme, etc. as needed for gas & bloating.  °5) Prilosec (omeprazole) over-the-counter as needed °6)  Consider probiotics (Align, Activa, etc) to help calm the bowels down ° °Call your doctor if you are getting worse or not getting better.  Sometimes further testing (cultures, endoscopy, X-ray studies, CT scans, bloodwork, etc.) may be needed to help diagnose and treat the cause of the diarrhea. °[facility] °[address], [address], [place], [place]  [address] °[phone] - Main.    °[phone]  - Toll Free.   [phone] - Fax °[email or web address] ° ° °

## 2017-06-09 LAB — BASIC METABOLIC PANEL
Anion gap: 9 (ref 5–15)
BUN: 20 mg/dL (ref 6–20)
CHLORIDE: 105 mmol/L (ref 101–111)
CO2: 25 mmol/L (ref 22–32)
Calcium: 8.7 mg/dL — ABNORMAL LOW (ref 8.9–10.3)
Creatinine, Ser: 0.84 mg/dL (ref 0.61–1.24)
GFR calc non Af Amer: >60 mL/min (ref >60)
Glucose, Bld: 119 mg/dL — ABNORMAL HIGH (ref 65–99)
Potassium: 3.8 mmol/L (ref 3.5–5.1)
Sodium: 139 mmol/L (ref 135–145)

## 2017-06-09 LAB — MAGNESIUM: Magnesium: 1.9 mg/dL (ref 1.7–2.4)

## 2017-06-09 LAB — CBC
HCT: 39.8 % (ref 39.0–52.0)
HEMOGLOBIN: 13.3 g/dL (ref 13.0–17.0)
MCH: 28.8 pg (ref 26.0–34.0)
MCHC: 33.4 g/dL (ref 30.0–36.0)
MCV: 86.1 fL (ref 78.0–100.0)
Platelets: 267 10*3/uL (ref 150–400)
RBC: 4.62 MIL/uL (ref 4.22–5.81)
RDW: 13.4 % (ref 11.5–15.5)
WBC: 12.8 10*3/uL — ABNORMAL HIGH (ref 4.0–10.5)

## 2017-06-09 MED ORDER — SODIUM CHLORIDE 0.9 % IV SOLN: 250.0000 mL | INTRAVENOUS | Status: DC | PRN

## 2017-06-09 MED ORDER — SODIUM CHLORIDE 0.9% FLUSH
3.0000 mL | Freq: Two times a day (BID) | INTRAVENOUS | Status: DC
  Administered 2017-06-09 (×2): 3 mL via INTRAVENOUS

## 2017-06-09 MED ORDER — SODIUM CHLORIDE 0.9% FLUSH: 3.0000 mL | INTRAVENOUS | Status: DC | PRN

## 2017-06-09 NOTE — Progress Notes (Signed)
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  More than 50% of that time was spent in counseling.  I updated the patient's status to the patient.  Recommendations were made.  Questions were answered.  He expressed understanding & appreciation.   William Adkins, M.D., F.A.C.S. Gastrointestinal and Minimally Invasive Surgery [facility] [address] [place], [place] [address] [phone] Main / Paging   06/09/2017    Subjective: (Chief complaint)  Denies pain Walked in hallways 2 small BMs  Objective:  Vital signs:  Vitals:   06/08/17 1940 06/08/17 2039 06/09/17 0256 06/09/17 0607  BP: 105/60  108/61 117/67 135/80  Pulse: 86 87 82 80  Resp: 14 18 20 20   Temp: 97.8 F (36.6 C) 98 F (36.7 C) 99.5 F (37.5 C) 98.6 F (37 C)  TempSrc: Oral Oral Oral Oral  SpO2: 100% 99% 95% 98%  Weight:   122.5 kg (270 lb 1 oz)   Height:   6\' 4"  (1.93 m)     Last BM Date: 06/08/17  Intake/Output   Yesterday:  06/20 0701 - 06/21 0700 In: 2100 [I.V.:2100] Out: 1245 [Urine:1195; Blood:50] This shift:  No intake/output data recorded.  Bowel function:  Flatus: YES  BM:  YES  Drain: (No drain)   Physical Exam:  General: Pt awake/alert/oriented x4 in no acute distress Eyes: PERRL, normal EOM.  Sclera clear.  No icterus Neuro: CN II-XII intact w/o focal sensory/motor deficits. Lymph: No head/neck/groin lymphadenopathy Psych:  No delerium/psychosis/paranoia HENT: Normocephalic, Mucus membranes moist.  No thrush Neck: Supple, No tracheal deviation Chest: No chest wall pain w good excursion CV:  Pulses intact.  Regular rhythm MS: Normal AROM mjr joints.  No obvious deformity  Abdomen: Soft.  Nondistended.  Nontender.  No evidence of peritonitis.  No incarcerated hernias.  Ext:  No deformity.  No mjr edema.  No cyanosis Skin: No petechiae / purpura  Results:   Labs: No results found for this or any previous visit (from the past 48 hour(s)).  Imaging / Studies: No results found.  Medications / Allergies: per chart  Antibiotics: Anti-infectives    Start     Dose/Rate Route Frequency Ordered Stop   06/08/17 2200  cefoTEtan (CEFOTAN) 2 g in dextrose 5 % 50 mL IVPB     2 g 100 mL/hr over 30 Minutes Intravenous Every 12 hours  06/08/17 1739 06/08/17 2321   06/08/17 1340  clindamycin (CLEOCIN) 900 mg, gentamicin (GARAMYCIN) 240 mg in sodium chloride 0.9 % 1,000 mL for intraperitoneal lavage  Status:  Discontinued       As needed 06/08/17 1341 06/08/17 1610   06/08/17 1100  clindamycin (CLEOCIN) 900 mg, gentamicin (GARAMYCIN) 240 mg in sodium chloride 0.9 % 1,000 mL for  intraperitoneal lavage  Status:  Discontinued      Intraperitoneal To Surgery 06/08/17 1051 06/08/17 1737   06/08/17 1051  cefoTEtan (CEFOTAN) 2 g in dextrose 5 % 50 mL IVPB     2 g 100 mL/hr over 30 Minutes Intravenous On call to O.R. 06/08/17 1051 06/08/17 1244   06/08/17 1051  neomycin (MYCIFRADIN) tablet 1,000 mg  Status:  Discontinued     1,000 mg Oral 3 times per day 06/08/17 1051 06/08/17 1100   06/08/17 1051  metroNIDAZOLE (FLAGYL) tablet 1,000 mg  Status:  Discontinued     1,000 mg Oral 3 times per day 06/08/17 1051 06/08/17 1100        Note: Portions of this report may have been transcribed using voice recognition software. Every effort was made to ensure accuracy; however, inadvertent computerized transcription errors may be present.   Any transcriptional errors that result from this process are unintentional.     William Adkins, M.D., F.A.C.S. Gastrointestinal and Minimally Invasive Surgery [facility] [address] [place], [place] [address] [phone] Main / Paging   06/09/2017

## 2017-06-10 NOTE — Discharge Summary (Signed)
Physician Discharge Summary  Patient ID: William Adkins MRN: [id number] DOB/AGE: 06-22-1966  51 y.o.  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  Postoperatively, the patient gradually mobilized and advanced to a solid diet.  Pain and other symptoms were treated aggressively.    By the time of discharge, the patient was walking well the hallways, eating food, having flatus.  Pain was well-controlled on an oral medications.  Based on meeting discharge criteria and continuing to recover, I felt it was safe for the patient to be discharged from the hospital to further recover with close followup. Postoperative recommendations were discussed in detail.  They are written as well.  Discharged Condition: good  Disposition:   Final discharge disposition not confirmed  Discharge Instructions    Call MD for:    Complete by:  As directed    FEVER > 101.5 F  (temperatures < 101.5 F are not significant)   Call MD for:    Complete by:  As directed    FEVER > 101.5 F  (temperatures < 101.5 F are not significant)   Call MD for:  extreme fatigue    Complete by:  As directed    Call MD for:  extreme fatigue    Complete by:  As directed    Call MD for:  persistant dizziness or light-headedness    Complete by:  As directed    Call MD for:   persistant dizziness or light-headedness    Complete by:  As directed    Call MD for:  persistant nausea and vomiting    Complete by:  As directed    Call MD for:  persistant nausea and vomiting    Complete by:  As directed    Call MD for:  redness, tenderness, or signs of infection (pain, swelling, redness, odor or green/yellow discharge around incision site)    Complete by:  As directed    Call MD for:  redness, tenderness, or signs of infection (pain, swelling, redness, odor or green/yellow discharge around incision site)    Complete by:  As directed    Call MD for:  severe uncontrolled pain    Complete by:  As directed    Call MD for:  severe uncontrolled pain    Complete by:  As directed    Diet - low sodium heart healthy    Complete by:  As directed    Follow a light diet the first few days at home.   Start with a bland diet such as soups, liquids, starchy foods, low fat foods, etc.   If you feel full, bloated, or constipated, stay on a full liquid or pureed/blenderized diet for a few days until you feel better and no longer constipated. Be sure to drink plenty of fluids every day to avoid getting dehydrated (feeling dizzy, not urinating, etc.). Gradually add a fiber supplement to your diet   Discharge instructions  Complete by:  As directed    See Discharge Instructions If you are not getting better after two weeks or are noticing you are getting worse, contact our office [phone] for further advice.  We may need to adjust your medications, re-evaluate you in the office, send you to the emergency room, or see what other things we can do to help. The clinic staff is available to answer your questions during regular business hours (8:30am-5pm).  Please don't hesitate to call and ask to speak to one of our nurses for clinical concerns.    A surgeon from [facility] is always on call at the hospitals 24 hours/day If you have a medical emergency, go to the nearest  emergency room or call 911.   Discharge instructions    Complete by:  As directed    See Discharge Instructions If you are not getting better after two weeks or are noticing you are getting worse, contact our office [phone] for further advice.  We may need to adjust your medications, re-evaluate you in the office, send you to the emergency room, or see what other things we can do to help. The clinic staff is available to answer your questions during regular business hours (8:30am-5pm).  Please don't hesitate to call and ask to speak to one of our nurses for clinical concerns.    A surgeon from [facility] is always on call at the hospitals 24 hours/day If you have a medical emergency, go to the nearest emergency room or call 911.   Driving Restrictions    Complete by:  As directed    You may drive when you are no longer taking narcotic prescription pain medication, you can comfortably wear a seatbelt, and you can safely make sudden turns/stops to protect yourself without hesitating due to pain.   Driving Restrictions    Complete by:  As directed    You may drive when you are no longer taking narcotic prescription pain medication, you can comfortably wear a seatbelt, and you can safely make sudden turns/stops to protect yourself without hesitating due to pain.   Increase activity slowly    Complete by:  As directed    Start light daily activities --- self-care, walking, climbing stairs- beginning the day after surgery.  Gradually increase activities as tolerated.  Control your pain to be active.  Stop when you are tired.  Ideally, walk several times a day, eventually an hour a day.   Most people are back to most day-to-day activities in a few weeks.  It takes 4-8 weeks to get back to unrestricted, intense activity. If you can walk 30 minutes without difficulty, it is safe to try more intense activity such as jogging, treadmill, bicycling, low-impact aerobics, swimming, etc. Save  the most intensive and strenuous activity for last (Usually 4-8 weeks after surgery) such as sit-ups, heavy lifting, contact sports, etc.  Refrain from any intense heavy lifting or straining until you are off narcotics for pain control.  You will have off days, but things should improve week-by-week. DO NOT PUSH THROUGH PAIN.  Let pain be your guide: If it hurts to do something, don't do it.  Pain is your body warning you to avoid that activity for another week until the pain goes down.   Increase activity slowly    Complete by:  As directed    Start light daily activities --- self-care, walking, climbing stairs- beginning the day after surgery.  Gradually increase activities  as tolerated.  Control your pain to be active.  Stop when you are tired.  Ideally, walk several times a day, eventually an hour a day.   Most people are back to most day-to-day activities in a few weeks.  It takes 4-8 weeks to get back to unrestricted, intense activity. If you can walk 30 minutes without difficulty, it is safe to try more intense activity such as jogging, treadmill, bicycling, low-impact aerobics, swimming, etc. Save the most intensive and strenuous activity for last (Usually 4-8 weeks after surgery) such as sit-ups, heavy lifting, contact sports, etc.  Refrain from any intense heavy lifting or straining until you are off narcotics for pain control.  You will have off days, but things should improve week-by-week. DO NOT PUSH THROUGH PAIN.  Let pain be your guide: If it hurts to do something, don't do it.  Pain is your body warning you to avoid that activity for another week until the pain goes down.   Lifting restrictions    Complete by:  As directed    If you can walk 30 minutes without difficulty, it is safe to try more intense activity such as jogging, treadmill, bicycling, low-impact aerobics, swimming, etc. Save the most intensive and strenuous activity for last (Usually 4-8 weeks after surgery) such as  sit-ups, heavy lifting, contact sports, etc.  Refrain from any intense heavy lifting or straining until you are off narcotics for pain control.  You will have off days, but things should improve week-by-week. DO NOT PUSH THROUGH PAIN.  Let pain be your guide: If it hurts to do something, don't do it.  Pain is your body warning you to avoid that activity for another week until the pain goes down.   Lifting restrictions    Complete by:  As directed    If you can walk 30 minutes without difficulty, it is safe to try more intense activity such as jogging, treadmill, bicycling, low-impact aerobics, swimming, etc. Save the most intensive and strenuous activity for last (Usually 4-8 weeks after surgery) such as sit-ups, heavy lifting, contact sports, etc.  Refrain from any intense heavy lifting or straining until you are off narcotics for pain control.  You will have off days, but things should improve week-by-week. DO NOT PUSH THROUGH PAIN.  Let pain be your guide: If it hurts to do something, don't do it.  Pain is your body warning you to avoid that activity for another week until the pain goes down.   May walk up steps    Complete by:  As directed    May walk up steps    Complete by:  As directed    No wound care    Complete by:  As directed    It is good for closed incision and even open wounds to be washed every day.  Shower every day.  Short baths are fine.  Wash the incisions and wounds clean with soap & water.    If you have a closed incision(s), wash the incision with soap & water every day.  You may leave closed incisions open to air if it is dry.   You may cover the incision with clean gauze & replace it after your daily shower for comfort. If you have skin tapes (Steristrips) or skin glue (Dermabond) on your incision, leave them in place.  They will fall off on their own like a scab.  You may trim any edges that curl up with clean scissors.  If you  have staples, set up an appointment for them to  be removed in the office in 10 days after surgery.  If you have a drain, wash around the skin exit site with soap & water and place a new dressing of gauze or band aid around the skin every day.  Keep the drain site clean & dry.   Remove dressing in 24 hours    Complete by:  As directed    Remove all clear tegaderm band aid dressings Saturday morning. Remove the 2 shoelace wicks out of lowest incision (just above bladder) at the same time   Sexual Activity Restrictions    Complete by:  As directed    You may have sexual intercourse when it is comfortable. If it hurts to do something, stop.   Sexual Activity Restrictions    Complete by:  As directed    You may have sexual intercourse when it is comfortable. If it hurts to do something, stop.      Allergies as of 06/10/2017   No Known Allergies     Medication List    TAKE these medications   amLODipine 10 MG tablet Commonly known as:  NORVASC Take 10 mg by mouth daily.   B COMPLEX PO Take 1 tablet by mouth 2 (two) times daily.   caffeine 200 MG Tabs tablet Take 200 mg by mouth 2 (two) times daily.   Fish Oil 1000 MG Caps Take 1,000 mg by mouth 2 (two) times daily.   Garlic 1683 MG Caps Take 1,000 mg by mouth daily.   ibuprofen 200 MG tablet Commonly known as:  ADVIL,MOTRIN Take 400-600 mg by mouth 3 (three) times daily as needed for mild pain.   loratadine 10 MG tablet Commonly known as:  CLARITIN Take 10 mg by mouth daily as needed for allergies.   methocarbamol 750 MG tablet Commonly known as:  ROBAXIN Take 1 tablet (750 mg total) by mouth 4 (four) times daily as needed (use for muscle cramps/pain).   multivitamin with minerals Tabs tablet Take 1 tablet by mouth daily.   OXYMETAZOLINE HCL NA Place 1 spray into both nostrils 2 (two) times daily as needed (decongestant).   traMADol 50 MG tablet Commonly known as:  ULTRAM Take 1-2 tablets (50-100 mg total) by mouth every 6 (six) hours as needed for moderate pain  or severe pain.   valsartan 320 MG tablet Commonly known as:  DIOVAN Take 320 mg by mouth daily.   VISINE OP Place 2 drops into both eyes 2 (two) times daily as needed (dry/red eyes).   vitamin C 500 MG tablet Commonly known as:  ASCORBIC ACID Take 1,000 mg by mouth daily.       Significant Diagnostic Studies:  Results for orders placed or performed during the hospital encounter of 06/08/17 (from the past 72 hour(s))  Basic metabolic panel     Status: Abnormal   Collection Time: 06/09/17  9:58 AM  Result Value Ref Range   Sodium 139 135 - 145 mmol/L   Potassium 3.8 3.5 - 5.1 mmol/L   Chloride 105 101 - 111 mmol/L   CO2 25 22 - 32 mmol/L   Glucose, Bld 119 (H) 65 - 99 mg/dL   BUN 20 6 - 20 mg/dL   Creatinine, Ser 0.84 0.61 - 1.24 mg/dL   Calcium 8.7 (L) 8.9 - 10.3 mg/dL   GFR calc non Af Amer >60 >60 mL/min   GFR calc Af Amer >60 >60 mL/min    Comment: (  NOTE) The eGFR has been calculated using the CKD EPI equation. This calculation has not been validated in all clinical situations. eGFR's persistently <60 mL/min signify possible Chronic Kidney Disease.    Anion gap 9 5 - 15  CBC     Status: Abnormal   Collection Time: 06/09/17  9:58 AM  Result Value Ref Range   WBC 12.8 (H) 4.0 - 10.5 K/uL   RBC 4.62 4.22 - 5.81 MIL/uL   Hemoglobin 13.3 13.0 - 17.0 g/dL   HCT 39.8 39.0 - 52.0 %   MCV 86.1 78.0 - 100.0 fL   MCH 28.8 26.0 - 34.0 pg   MCHC 33.4 30.0 - 36.0 g/dL   RDW 13.4 11.5 - 15.5 %   Platelets 267 150 - 400 K/uL  Magnesium     Status: None   Collection Time: 06/09/17  9:58 AM  Result Value Ref Range   Magnesium 1.9 1.7 - 2.4 mg/dL    No results found.  Discharge Exam: Blood pressure 137/84, pulse 73, temperature 97.9 F (36.6 C), temperature source Oral, resp. rate 18, height 6' 4"  (1.93 m), weight 123.6 kg (272 lb 7.8 oz), SpO2 98 %.  General: Pt awake/alert/oriented x4 in No acute distress Eyes: PERRL, normal EOM.  Sclera clear.  No icterus Neuro: CN  II-XII intact w/o focal sensory/motor deficits. Lymph: No head/neck/groin lymphadenopathy Psych:  No delerium/psychosis/paranoia HENT: Normocephalic, Mucus membranes moist.  No thrush Neck: Supple, No tracheal deviation Chest: No chest wall pain w good excursion CV:  Pulses intact.  Regular rhythm MS: Normal AROM mjr joints.  No obvious deformity Abdomen: Soft.  Nondistended.  Mildly tender at incisions only.  Dressings c/d/i.  No evidence of peritonitis.  No incarcerated hernias. Ext:  SCDs BLE.  No mjr edema.  No cyanosis Skin: No petechiae / purpura  Past Medical History:  Diagnosis Date  . Headache   . Hypertension   . Pre-diabetes     Past Surgical History:  Procedure Laterality Date  . colonoscopy    . SHOULDER SURGERY     right shoulder states "it would disclocate a lot; had to stretch tendon  and staple it to the top"    Social History   Social History  . Marital status: Married    Spouse name: N/A  . Number of children: N/A  . Years of education: N/A   Occupational History  . Not on file.   Social History Main Topics  . Smoking status: Former Smoker    Types: Cigarettes    Quit date: 06/01/2014  . Smokeless tobacco: Current User    Types: Chew  . Alcohol use Yes     Comment: occasionally   . Drug use: No  . Sexual activity: Yes   Other Topics Concern  . Not on file   Social History Narrative  . No narrative on file    History reviewed. No pertinent family history.  Current Facility-Administered Medications  Medication Dose Route Frequency Provider Last Rate Last Dose  . 0.9 %  sodium chloride infusion  250 mL Intravenous PRN Michael Boston, MD      . 0.9 %  sodium chloride infusion  250 mL Intravenous PRN Michael Boston, MD      . acetaminophen (TYLENOL) tablet 1,000 mg  1,000 mg Oral TID Michael Boston, MD   1,000 mg at 06/09/17 2202  . alum & mag hydroxide-simeth (MAALOX/MYLANTA) 200-200-20 MG/5ML suspension 30 mL  30 mL Oral Q6H PRN Michael Boston,  MD      .  alvimopan (ENTEREG) capsule 12 mg  12 mg Oral BID Michael Boston, MD      . amLODipine (NORVASC) tablet 10 mg  10 mg Oral Daily Michael Boston, MD   10 mg at 06/09/17 0913  . diphenhydrAMINE (BENADRYL) 12.5 MG/5ML elixir 12.5 mg  12.5 mg Oral Q6H PRN Michael Boston, MD       Or  . diphenhydrAMINE (BENADRYL) injection 12.5 mg  12.5 mg Intravenous Q6H PRN Michael Boston, MD      . enalaprilat (VASOTEC) injection 0.625-1.25 mg  0.625-1.25 mg Intravenous Q6H PRN Michael Boston, MD      . enoxaparin (LOVENOX) injection 40 mg  40 mg Subcutaneous Q24H Michael Boston, MD   40 mg at 06/09/17 0900  . feeding supplement (ENSURE SURGERY) liquid 237 mL  237 mL Oral BID BM Michael Boston, MD   237 mL at 06/09/17 0914  . guaiFENesin-dextromethorphan (ROBITUSSIN DM) 100-10 MG/5ML syrup 10 mL  10 mL Oral Q4H PRN Michael Boston, MD      . hydrocortisone (ANUSOL-HC) 2.5 % rectal cream 1 application  1 application Topical QID PRN Michael Boston, MD      . hydrocortisone cream 1 % 1 application  1 application Topical TID PRN Michael Boston, MD      . HYDROmorphone (DILAUDID) injection 0.5-2 mg  0.5-2 mg Intravenous Q2H PRN Michael Boston, MD   1 mg at 06/08/17 1947  . ibuprofen (ADVIL,MOTRIN) tablet 400-600 mg  400-600 mg Oral TID PRN Michael Boston, MD   600 mg at 06/09/17 2010  . irbesartan (AVAPRO) tablet 150 mg  150 mg Oral Daily Michael Boston, MD   150 mg at 06/09/17 0913  . lactated ringers infusion 1,000 mL  1,000 mL Intravenous Q8H PRN Michael Boston, MD      . lactated ringers infusion   Intravenous Continuous Myrtie Soman, MD 100 mL/hr at 06/08/17 1657 1,000 mL at 06/08/17 1657  . lip balm (CARMEX) ointment 1 application  1 application Topical BID Michael Boston, MD   1 application at 41/74/08 2203  . loratadine (CLARITIN) tablet 10 mg  10 mg Oral Daily PRN Michael Boston, MD      . magic mouthwash  15 mL Oral QID PRN Michael Boston, MD      . menthol-cetylpyridinium (CEPACOL) lozenge 3 mg  1 lozenge Oral PRN  Michael Boston, MD      . metoprolol tartrate (LOPRESSOR) injection 5 mg  5 mg Intravenous Q6H PRN Michael Boston, MD      . multivitamin with minerals tablet 1 tablet  1 tablet Oral Daily Michael Boston, MD   1 tablet at 06/09/17 0913  . ondansetron (ZOFRAN) tablet 4 mg  4 mg Oral Q6H PRN Michael Boston, MD       Or  . ondansetron (ZOFRAN) injection 4 mg  4 mg Intravenous Q6H PRN Michael Boston, MD      . oxymetazoline (AFRIN) 0.05 % nasal spray 2 spray  2 spray Each Nare BID PRN Michael Boston, MD      . phenol (CHLORASEPTIC) mouth spray 1-2 spray  1-2 spray Mouth/Throat PRN Michael Boston, MD      . prochlorperazine (COMPAZINE) injection 5-10 mg  5-10 mg Intravenous Q4H PRN Michael Boston, MD      . saccharomyces boulardii (FLORASTOR) capsule 250 mg  250 mg Oral BID Michael Boston, MD   250 mg at 06/09/17 2202  . sodium chloride flush (NS) 0.9 % injection 3 mL  3 mL Intravenous Q12H Michael Boston,  MD      . sodium chloride flush (NS) 0.9 % injection 3 mL  3 mL Intravenous PRN Michael Boston, MD      . sodium chloride flush (NS) 0.9 % injection 3 mL  3 mL Intravenous Gorden Harms, MD   3 mL at 06/09/17 2203  . sodium chloride flush (NS) 0.9 % injection 3 mL  3 mL Intravenous PRN Michael Boston, MD      . tetrahydrozoline 0.05 % ophthalmic solution 2 drop  2 drop Both Eyes BID PRN Michael Boston, MD      . vitamin C (ASCORBIC ACID) tablet 1,000 mg  1,000 mg Oral Daily Michael Boston, MD   1,000 mg at 06/09/17 0913  . zolpidem (AMBIEN) tablet 5 mg  5 mg Oral QHS PRN,MR X 1 Michael Boston, MD         No Known Allergies  Signed: Morton Peters, M.D., F.A.C.S. Gastrointestinal and Minimally Invasive Surgery [facility] [address] [place], [place] [address] [phone] Main / Paging   06/10/2017, 7:31 AM

## 2017-06-10 NOTE — Progress Notes (Signed)
Pt tolerating diet, no complaints of pain, at least nothing he wanted medication for.  D/C instructions and prescription were given and understanding was verbalized.

## 2017-06-10 NOTE — Progress Notes (Signed)
Pharmacy Brief Note - Alvimopan (Entereg)  The standing order set for alvimopan (Entereg) now includes an automatic order to discontinue the drug after the patient has had a bowel movement. The change was approved by the [place] and the Medical Executive Committee.   This patient has had bowel movements documented by nursing. Therefore, alvimopan has been discontinued. If there are questions, please contact the pharmacy at [phone].   Thank you-  Dia Sitter, PharmD, BCPS 06/10/2017 7:43 AM

## 2021-08-23 ENCOUNTER — Emergency Department (HOSPITAL_COMMUNITY)
Admission: EM | Admit: 2021-08-23 | Discharge: 2021-08-24 | Disposition: A | Discharge status: Home / Self Care | Payer: 59 | Attending: Emergency Medicine | Admitting: Emergency Medicine

## 2021-08-23 ENCOUNTER — Emergency Department (HOSPITAL_COMMUNITY): Payer: 59

## 2021-08-23 ENCOUNTER — Encounter (HOSPITAL_COMMUNITY): Payer: Self-pay | Admitting: Emergency Medicine

## 2021-08-23 ENCOUNTER — Other Ambulatory Visit: Payer: Self-pay

## 2021-08-23 DIAGNOSIS — R112 Nausea with vomiting, unspecified: Secondary | ICD-10-CM | POA: Diagnosis not present

## 2021-08-23 DIAGNOSIS — Z79899 Other long term (current) drug therapy: Secondary | ICD-10-CM | POA: Diagnosis not present

## 2021-08-23 DIAGNOSIS — N200 Calculus of kidney: Secondary | ICD-10-CM

## 2021-08-23 DIAGNOSIS — Z87891 Personal history of nicotine dependence: Secondary | ICD-10-CM | POA: Diagnosis not present

## 2021-08-23 DIAGNOSIS — I10 Essential (primary) hypertension: Secondary | ICD-10-CM | POA: Insufficient documentation

## 2021-08-23 DIAGNOSIS — R140 Abdominal distension (gaseous): Secondary | ICD-10-CM | POA: Insufficient documentation

## 2021-08-23 DIAGNOSIS — R1031 Right lower quadrant pain: Secondary | ICD-10-CM | POA: Diagnosis not present

## 2021-08-23 DIAGNOSIS — R059 Cough, unspecified: Secondary | ICD-10-CM | POA: Insufficient documentation

## 2021-08-23 LAB — COMPREHENSIVE METABOLIC PANEL
ALT: 41 U/L (ref 0–44)
AST: 33 U/L (ref 15–41)
Albumin: 4.4 g/dL (ref 3.5–5.0)
Alkaline Phosphatase: 59 U/L (ref 38–126)
Anion gap: 14 (ref 5–15)
BUN: 34 mg/dL — ABNORMAL HIGH (ref 6–20)
CO2: 26 mmol/L (ref 22–32)
Calcium: 9.8 mg/dL (ref 8.9–10.3)
Chloride: 103 mmol/L (ref 98–111)
Creatinine, Ser: 1.32 mg/dL — ABNORMAL HIGH (ref 0.61–1.24)
GFR, Estimated: >60 mL/min (ref >60)
Glucose, Bld: 381 mg/dL — ABNORMAL HIGH (ref 70–99)
Potassium: 3.7 mmol/L (ref 3.5–5.1)
Sodium: 143 mmol/L (ref 135–145)
Total Bilirubin: 0.7 mg/dL (ref 0.3–1.2)
Total Protein: 7.8 g/dL (ref 6.5–8.1)

## 2021-08-23 LAB — CBC WITH DIFFERENTIAL/PLATELET
Abs Immature Granulocytes: 0.03 10*3/uL (ref 0.00–0.07)
Basophils Absolute: 0 10*3/uL (ref 0.0–0.1)
Basophils Relative: 0 %
Eosinophils Absolute: 0 10*3/uL (ref 0.0–0.5)
Eosinophils Relative: 0 %
HCT: 41.8 % (ref 39.0–52.0)
Hemoglobin: 14.3 g/dL (ref 13.0–17.0)
Immature Granulocytes: 0 %
Lymphocytes Relative: 11 %
Lymphs Abs: 1.3 10*3/uL (ref 0.7–4.0)
MCH: 29.8 pg (ref 26.0–34.0)
MCHC: 34.2 g/dL (ref 30.0–36.0)
MCV: 87.1 fL (ref 80.0–100.0)
Monocytes Absolute: 0.9 10*3/uL (ref 0.1–1.0)
Monocytes Relative: 8 %
Neutro Abs: 9.2 10*3/uL — ABNORMAL HIGH (ref 1.7–7.7)
Neutrophils Relative %: 81 %
Platelets: 459 10*3/uL — ABNORMAL HIGH (ref 150–400)
RBC: 4.8 MIL/uL (ref 4.22–5.81)
RDW: 11.9 % (ref 11.5–15.5)
WBC: 11.4 10*3/uL — ABNORMAL HIGH (ref 4.0–10.5)
nRBC: 0 % (ref 0.0–0.2)

## 2021-08-23 LAB — LACTIC ACID, PLASMA: Lactic Acid, Venous: 3.6 mmol/L (ref 0.5–1.9)

## 2021-08-23 LAB — LIPASE, BLOOD: Lipase: 36 U/L (ref 11–51)

## 2021-08-23 MED ORDER — SODIUM CHLORIDE 0.9 % IV BOLUS
1000.0000 mL | Freq: Once | INTRAVENOUS | Status: AC
  Administered 2021-08-23: 1000 mL via INTRAVENOUS

## 2021-08-23 MED ORDER — IOHEXOL 350 MG/ML SOLN
80.0000 mL | Freq: Once | INTRAVENOUS | Status: AC | PRN
  Administered 2021-08-24: 80 mL via INTRAVENOUS

## 2021-08-23 MED ORDER — ONDANSETRON HCL 4 MG/2ML IJ SOLN
4.0000 mg | Freq: Once | INTRAMUSCULAR | Status: AC
  Administered 2021-08-23: 4 mg via INTRAVENOUS
  Filled 2021-08-23: qty 2

## 2021-08-23 MED ORDER — HYDROMORPHONE HCL 1 MG/ML IJ SOLN
1.0000 mg | Freq: Once | INTRAMUSCULAR | Status: AC
  Administered 2021-08-23: 1 mg via INTRAVENOUS
  Filled 2021-08-23: qty 1

## 2021-08-23 NOTE — ED Triage Notes (Signed)
Per EMS, patient c/o RLQ pain with tenderness x45 minutes. One episode of vomiting.

## 2021-08-23 NOTE — ED Provider Notes (Signed)
[facility] DEPT Provider Note   CSN: [id number] Arrival date & time: 08/23/21  2017     History Chief Complaint  Patient presents with   Abdominal Pain    William Adkins is a 55 y.o. male.  The history is provided by the patient and medical records. No language interpreter was used.  Abdominal Pain Pain location:  RLQ Pain quality: aching   Pain radiates to:  Does not radiate Pain severity:  Severe Onset quality:  Sudden Duration:  1 hour Timing:  Constant Progression:  Unchanged Chronicity:  New Context: previous surgery   Relieved by:  Nothing Worsened by:  Palpation and movement Ineffective treatments:  None tried Associated symptoms: cough, nausea and vomiting   Associated symptoms: no chest pain, no chills, no constipation, no diarrhea, no dysuria, no fatigue, no fever, no flatus and no shortness of breath       Past Medical History:  Diagnosis Date   Headache    Hypertension    Pre-diabetes     Patient Active Problem List   Diagnosis Date Noted   Hypertension    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  Social History   Tobacco Use   Smoking status: Former    Types: Cigarettes    Quit date: 06/01/2014    Years since quitting: 7.2   Smokeless tobacco: Current    Types: Chew  Substance Use Topics   Alcohol use: Yes    Comment: occasionally    Drug use: No    Home Medications Prior to Admission medications   Medication Sig Start Date End Date Taking? Authorizing Provider  amLODipine (NORVASC) 10 MG tablet Take 10 mg by mouth daily. 04/04/17   [provider]  B Complex Vitamins (B COMPLEX PO) Take 1 tablet by mouth 2 (two) times  daily.    [provider]  caffeine 200 MG TABS tablet Take 200 mg by mouth 2 (two) times daily.    [provider]  Garlic [id number] MG CAPS Take 1,000 mg by mouth daily.    [provider]  ibuprofen (ADVIL,MOTRIN) 200 MG tablet Take 400-600 mg by mouth 3 (three) times daily as needed for mild pain.    [provider]  loratadine (CLARITIN) 10 MG tablet Take 10 mg by mouth daily as needed for allergies.    [provider]  methocarbamol (ROBAXIN) 750 MG tablet Take 1 tablet (750 mg total) by mouth 4 (four) times daily as needed (use for muscle cramps/pain). 06/08/17   Michael Boston, MD  Multiple Vitamin (MULTIVITAMIN WITH MINERALS) TABS tablet Take 1 tablet by mouth daily.    [provider]  Omega-3 Fatty Acids (FISH OIL) 1000 MG CAPS Take 1,000 mg by mouth 2 (two) times daily.    [provider]  OXYMETAZOLINE HCL NA Place 1 spray into both nostrils 2 (two) times daily as needed (decongestant).    [provider]  Tetrahydrozoline HCl (VISINE OP) Place 2 drops into both eyes 2 (two) times daily as needed (dry/red eyes).    [provider]  traMADol (ULTRAM) 50 MG tablet Take 1-2 tablets (50-100 mg total) by  mouth every 6 (six) hours as needed for moderate pain or severe pain. 06/08/17   Michael Boston, MD  valsartan (DIOVAN) 320 MG tablet Take 320 mg by mouth daily. 04/04/17   [provider]  vitamin C (ASCORBIC ACID) 500 MG tablet Take 1,000 mg by mouth daily.    [provider]    Allergies    Patient has no known allergies.  Review of Systems   Review of Systems  Constitutional:  Negative for chills, diaphoresis, fatigue and fever.  HENT:  Negative for congestion.   Respiratory:  Positive for cough. Negative for chest tightness, shortness of breath and wheezing.   Cardiovascular:  Negative for chest pain, palpitations and leg swelling.  Gastrointestinal:  Positive for abdominal distention,  abdominal pain, nausea and vomiting. Negative for constipation, diarrhea and flatus.  Genitourinary:  Negative for dysuria, flank pain and frequency.  Musculoskeletal:  Negative for back pain.  Skin:  Negative for rash and wound.  Neurological:  Negative for light-headedness, numbness and headaches.  Psychiatric/Behavioral:  Negative for agitation and confusion.   All other systems reviewed and are negative.  Physical Exam Updated Vital Signs BP 139/70 (BP Location: Left Arm)   Pulse 62   Temp 98 F (36.7 C)   Resp (!) 21   Ht '6\' 4"'$  (1.93 m)   Wt 124.7 kg   SpO2 95%   BMI 33.47 kg/m   Physical Exam Vitals and nursing note reviewed.  Constitutional:      General: He is not in acute distress.    Appearance: He is well-developed. He is not ill-appearing, toxic-appearing or diaphoretic.  HENT:     Head: Normocephalic and atraumatic.  Eyes:     Conjunctiva/sclera: Conjunctivae normal.  Cardiovascular:     Rate and Rhythm: Normal rate and regular rhythm.     Heart sounds: No murmur heard. Pulmonary:     Effort: Pulmonary effort is normal. No respiratory distress.     Breath sounds: Normal breath sounds. No wheezing, rhonchi or rales.  Chest:     Chest wall: No tenderness.  Abdominal:     General: There is distension.     Palpations: Abdomen is soft.     Tenderness: There is abdominal tenderness in the right lower quadrant and suprapubic area.  Musculoskeletal:     Cervical back: Neck supple.  Skin:    General: Skin is warm and dry.  Neurological:     General: No focal deficit present.     Mental Status: He is alert.  Psychiatric:        Mood and Affect: Mood normal.    ED Results / Procedures / Treatments   Labs (all labs ordered are listed, but only abnormal results are displayed) Labs Reviewed  CBC WITH DIFFERENTIAL/PLATELET - Abnormal; Notable for the following components:      Result Value   WBC 11.4 (*)    Platelets 459 (*)    Neutro Abs 9.2 (*)    All  other components within normal limits  COMPREHENSIVE METABOLIC PANEL - Abnormal; Notable for the following components:   Glucose, Bld 381 (*)    BUN 34 (*)    Creatinine, Ser 1.32 (*)    All other components within normal limits  LACTIC ACID, PLASMA - Abnormal; Notable for the following components:   Lactic Acid, Venous 3.6 (*)    All other components within normal limits  URINE CULTURE  LIPASE, BLOOD  URINALYSIS, ROUTINE W REFLEX MICROSCOPIC  LACTIC ACID, PLASMA  EKG None  Radiology DG Chest Portable 1 View  Result Date: 08/23/2021 CLINICAL DATA:  Cough. COVID 2 weeks ago. Concern for bowel obstruction. EXAM: PORTABLE CHEST 1 VIEW COMPARISON:  Remote radiograph 02/27/2010 FINDINGS: Borderline hyperinflation. No focal airspace disease. Normal heart size and mediastinal contours. No pneumothorax or large pleural effusion. No pulmonary edema. Postsurgical change in the right glenoid. No acute osseous abnormalities are seen. IMPRESSION: Borderline hyperinflation without acute abnormality. Electronically Signed   By: Keith Rake M.D.   On: 08/23/2021 22:24    Procedures Procedures   Medications Ordered in ED Medications  iohexol (OMNIPAQUE) 350 MG/ML injection 80 mL (has no administration in time range)  HYDROmorphone (DILAUDID) injection 1 mg (1 mg Intravenous Given 08/23/21 2204)  ondansetron (ZOFRAN) injection 4 mg (4 mg Intravenous Given 08/23/21 2204)  sodium chloride 0.9 % bolus 1,000 mL (1,000 mLs Intravenous New Bag/Given 08/23/21 2204)    ED Course  I have reviewed the triage vital signs and the nursing notes.  Pertinent labs & imaging results that were available during my care of the patient were reviewed by me and considered in my medical decision making (see chart for details).    MDM Rules/Calculators/A&P                           William Adkins is a 55 y.o. male with a past medical history significant for hypertension colon cancer status post left colectomy who  presents with severe abdominal pain, nausea, and vomiting.  According to the patient, he had COVID 2 weeks ago and is still had a mild cough.  Denies any chest pain or shortness of breath reports around an hour prior to arrival, he had onset of severe right lower quadrant primarily abdominal pain.  It was greater than 10 out of 10.  He reports he has had nausea and vomiting and his abdomen feels distended.  He has not passed any gas since then.  He says he had a bowel movement this morning that was normal and denies any groin or testicle pain.  He denies any recent trauma and denies any other fevers or chills.  Denies any urinary changes.  Denies other complaints.  On my exam, patient is dry heaving and vomiting and doubled over with pain.  He looks very uncomfortable.  Lungs are clear and chest is nontender.  Abdomen is tender in the right lower quadrant primarily.  Difficult to auscultate bowel sounds with all of his vomiting.  Otherwise vital signs are reassuring on arrival.  We will give patient pain medicine, nausea medicine, and fluids and make him n.p.o.  I am concerned about bowel obstruction given his history of bowel surgery.  We will get CT abdomen pelvis and get screening labs.  As he had COVID 2 weeks ago, will not repeat COVID test.  We will get chest x-ray with his residual cough.  Anticipate reassessment after work-up to determine disposition.  Care transferred to oncoming team while waiting for results of lab testing and CT scan.  Anticipate disposition in based on findings and reassessment.   Final Clinical Impression(s) / ED Diagnoses Final diagnoses:  RLQ abdominal pain  Intractable vomiting with nausea, unspecified vomiting type    Clinical Impression: 1. RLQ abdominal pain   2. Intractable vomiting with nausea, unspecified vomiting type     Disposition: Care transferred to oncoming team while waiting for results of lab testing and CT scan.  Anticipate disposition in based  on findings and reassessment.  This note was prepared with assistance of Systems analyst. Occasional wrong-word or sound-a-like substitutions may have occurred due to the inherent limitations of voice recognition software.     William Adkins, Gwenyth Allegra, MD 08/24/21 Laureen Abrahams

## 2021-08-24 LAB — URINALYSIS, ROUTINE W REFLEX MICROSCOPIC
Bilirubin Urine: NEGATIVE
Glucose, UA: >1000 mg/dL — AB
Ketones, ur: 15 mg/dL — AB
Leukocytes,Ua: NEGATIVE
Nitrite: NEGATIVE
Protein, ur: NEGATIVE mg/dL
RBC / HPF: >50 RBC/hpf — ABNORMAL HIGH (ref 0–5)
Specific Gravity, Urine: 1.01 (ref 1.005–1.030)
pH: 6 (ref 5.0–8.0)

## 2021-08-24 LAB — LACTIC ACID, PLASMA: Lactic Acid, Venous: 2.6 mmol/L (ref 0.5–1.9)

## 2021-08-24 MED ORDER — TAMSULOSIN HCL 0.4 MG PO CAPS: 0.4000 mg | ORAL_CAPSULE | Freq: Every day | ORAL | 0 refills | Status: DC

## 2021-08-24 MED ORDER — HYDROMORPHONE HCL 1 MG/ML IJ SOLN
1.0000 mg | Freq: Once | INTRAMUSCULAR | Status: AC
  Administered 2021-08-24: 1 mg via INTRAVENOUS
  Filled 2021-08-24: qty 1

## 2021-08-24 MED ORDER — OXYCODONE HCL 5 MG PO TABS: 5.0000 mg | ORAL_TABLET | ORAL | 0 refills | Status: DC | PRN

## 2021-08-24 NOTE — ED Provider Notes (Addendum)
  Provider Note MRN:  [id number]  Arrival date & time: 08/24/21    ED Course and Medical Decision Making  Assumed care from Dr. Sherry Ruffing at shift change.  Abdominal pain suspicious for appendicitis awaiting CT imaging.  CT reveals obstructing 1 mm stone with hydronephrosis.  Remainder of labs and urinalysis are reassuring, mild AKI but given IV fluids.  On reassessment patient is feeling much better, appropriate for discharge, will advise urology follow-up.  Procedures  Final Clinical Impressions(s) / ED Diagnoses     ICD-10-CM   1. RLQ abdominal pain  R10.31     2. Intractable vomiting with nausea, unspecified vomiting type  R11.2     3. Kidney stone  N20.0       ED Discharge Orders          Ordered    tamsulosin (FLOMAX) 0.4 MG CAPS capsule  Daily        08/24/21 0325    oxyCODONE (ROXICODONE) 5 MG immediate release tablet  Every 4 hours PRN        08/24/21 0325              Discharge Instructions      You were evaluated in the Emergency Department and after careful evaluation, we did not find any emergent condition requiring admission or further testing in the hospital.  Your exam/testing today was overall reassuring.  Symptoms seem to be due to a kidney stone.  Recommend follow-up with urology.  Use the Flomax daily to help pass the stone.  Recommend Tylenol 1000 mg every 4-6 hours and/or Motrin 600 mg every 4-6 hours for pain.  You can use the oxycodone medication for more significant pain.  Please return to the Emergency Department if you experience any worsening of your condition.  Thank you for allowing [place] to be a part of your care.       Barth Kirks. Sedonia Small, [email or web address]    Maudie Flakes, MD 08/24/21 0327    Maudie Flakes, MD 08/24/21 [phone]

## 2021-08-24 NOTE — Discharge Instructions (Addendum)
You were evaluated in the Emergency Department and after careful evaluation, we did not find any emergent condition requiring admission or further testing in the hospital.  Your exam/testing today was overall reassuring.  Symptoms seem to be due to a kidney stone.  Recommend follow-up with urology.  Use the Flomax daily to help pass the stone.  Recommend Tylenol 1000 mg every 4-6 hours and/or Motrin 600 mg every 4-6 hours for pain.  You can use the oxycodone medication for more significant pain.  Please return to the Emergency Department if you experience any worsening of your condition.  Thank you for allowing [place] to be a part of your care.

## 2021-08-24 NOTE — ED Notes (Signed)
Patient transported to CT 

## 2021-08-24 NOTE — ED Notes (Signed)
Pt's wife called with no answer. Voicemail left notifying her that pt was discharged and ready to be picked up.

## 2021-08-25 LAB — URINE CULTURE: Culture: NO GROWTH

## 2022-04-28 ENCOUNTER — Ambulatory Visit: Payer: 59 | Admitting: Family Medicine

## 2022-05-03 ENCOUNTER — Encounter: Payer: Self-pay | Admitting: Family Medicine

## 2022-05-03 ENCOUNTER — Ambulatory Visit (INDEPENDENT_AMBULATORY_CARE_PROVIDER_SITE_OTHER): Payer: 59 | Admitting: Family Medicine

## 2022-05-03 VITALS — BP 130/84 | HR 95 | Temp 98.2°F | Ht 76.0 in | Wt 254.5 lb

## 2022-05-03 DIAGNOSIS — E1165 Type 2 diabetes mellitus with hyperglycemia: Secondary | ICD-10-CM

## 2022-05-03 DIAGNOSIS — Z23 Encounter for immunization: Secondary | ICD-10-CM

## 2022-05-03 DIAGNOSIS — I10 Essential (primary) hypertension: Secondary | ICD-10-CM

## 2022-05-03 MED ORDER — MONTELUKAST SODIUM 10 MG PO TABS: 10.0000 mg | ORAL_TABLET | Freq: Every day | ORAL | 1 refills | Status: DC

## 2022-05-03 MED ORDER — TRULICITY 1.5 MG/0.5ML ~~LOC~~ SOAJ: 1.5000 mg | SUBCUTANEOUS | 5 refills | Status: DC

## 2022-05-03 MED ORDER — GLYBURIDE 5 MG PO TABS: 5.0000 mg | ORAL_TABLET | Freq: Every day | ORAL | 2 refills | Status: DC

## 2022-05-03 NOTE — Progress Notes (Signed)
Chief Complaint  ?Patient presents with  ? New Patient (Initial Visit)  ? ? ?   New Patient Visit ?SUBJECTIVE: ?HPI: William Adkins is an 56 y.o.male who is being seen for establishing care.  He is here with his wife who is also seen here. ? ?The patient was previously seen at [facility]. ? ?DM- last A1c was 13.8 , started on Trulicity 1.5 mg/week. Diet is improved. Active at work. He does follow w an eye doctor. Urine sample, foot exam and a1c was done on 03/01/22.  He is on pravastatin 80 milligrams daily.  He has never had a pneumonia vaccine.  He has a family history of diabetes in both of his parents.  He is strongly against taking metformin.  He is also strongly against taking insulin. ? ?Hypertension ?Patient presents for hypertension follow up. ?He does not monitor home blood pressures. ?He is compliant with medications- chlorthalidone 25 mg/d, Benicar 40 mg/d, Norvasc 10 mg/d.  ?Patient has these side effects of medication: none ?No Cp or SOB.  ? ?Past Medical History:  ?Diagnosis Date  ? Allergy   ? Diabetes mellitus without complication ([place])   ? Hypertension   ? ?Past Surgical History:  ?Procedure Laterality Date  ? colonoscopy    ? SHOULDER SURGERY  1998  ? right shoulder states "it would disclocate a lot; had to stretch tendon  and staple it to the top"  ? ?Family History  ?Problem Relation Age of Onset  ? Diabetes Mother   ? Heart disease Father   ? Hypertension Father   ? Diabetes Father   ? Hypertension Brother   ? Heart disease Brother   ? Diabetes Brother   ? Heart attack Brother   ? Colon cancer Paternal Aunt   ? Prostate cancer Neg Hx   ? ?No Known Allergies ? ?Current Outpatient Medications:  ?  amLODipine (NORVASC) 10 MG tablet, Take 10 mg by mouth daily., Disp: , Rfl:  ?  chlorthalidone (HYGROTON) 25 MG tablet, Take 25 mg by mouth daily., Disp: , Rfl:  ?  esomeprazole (NEXIUM) 20 MG capsule, Take 20 mg by mouth daily at 12 noon., Disp: , Rfl:  ?  glyBURIDE (DIABETA) 5 MG tablet, Take 1  tablet (5 mg total) by mouth daily with breakfast., Disp: 30 tablet, Rfl: 2 ?  ibuprofen (ADVIL,MOTRIN) 200 MG tablet, Take 400-600 mg by mouth 3 (three) times daily as needed for mild pain., Disp: , Rfl:  ?  Multiple Vitamin (MULTIVITAMIN WITH MINERALS) TABS tablet, Take 1 tablet by mouth daily., Disp: , Rfl:  ?  Multiple Vitamins-Minerals (ONE-A-DAY 50 PLUS PO), Take 1 tablet by mouth daily., Disp: , Rfl:  ?  olmesartan (BENICAR) 40 MG tablet, Take 40 mg by mouth daily., Disp: , Rfl:  ?  pravastatin (PRAVACHOL) 80 MG tablet, Take 80 mg by mouth daily., Disp: , Rfl:  ?  Probiotic Product (PROBIOTIC ADVANCED PO), Take 1 capsule by mouth daily., Disp: , Rfl:  ?  Dulaglutide (TRULICITY) 1.5 JI/9.6VE SOPN, Inject 1.5 mg into the skin once a week., Disp: 4 mL, Rfl: 5 ?  montelukast (SINGULAIR) 10 MG tablet, Take 1 tablet (10 mg total) by mouth at bedtime., Disp: 90 tablet, Rfl: 1 ? ?OBJECTIVE: ?BP 130/84   Pulse 95   Temp 98.2 ?F (36.8 ?C) (Oral)   Ht '6\' 4"'$  (1.93 m)   Wt 254 lb 8 oz (115.4 kg)   SpO2 99%   BMI 30.98 kg/m?  ?General:  well developed,  well nourished, in no apparent distress ?Skin:  no significant moles, warts, or growths ?Lungs:  clear to auscultation, breath sounds equal bilaterally, no respiratory distress ?Cardio:  regular rate and rhythm, no LE edema or bruits ?Musculoskeletal:  symmetrical muscle groups noted without atrophy or deformity ?Neuro:  gait normal ?Psych: well oriented with normal range of affect and appropriate judgment/insight ? ?ASSESSMENT/PLAN: ?Type 2 diabetes mellitus with hyperglycemia, without long-term current use of insulin ([place]) ? ?Essential hypertension ? ?Need for vaccination against Streptococcus pneumoniae - Plan: Pneumococcal conjugate vaccine 20-valent (Prevnar 20) ? ?Patient instructed to sign release of records form from their previous PCP. ?Chronic, not controlled.  Continue Trulicity 1.5 mg weekly.  Add glyburide 5 mg daily.  Hopefully we can lower his A1c to  below 9 and can start an SGLT2 inhibitor.  Counseled on diet and exercise.  He does have a glucometer at home, I requested he start checking his sugars 2-3 times per week.  Follow-up in 2 months. ?Chronic, stable.  Continue Norvasc 10 mg daily, Benicar 40 mg daily, chlorthalidone 25 mg daily. ?The patient voiced understanding and agreement to the plan. ? ? ?[place], DO ?05/03/22  ?3:16 PM ? ?

## 2022-05-03 NOTE — Patient Instructions (Addendum)
Keep the diet clean and stay active. ? ?Consider taking biotin for the hair.  ? ?Check your sugars around 2-3 times per week. Alternate checking in the morning before you eat, in the afternoon and before bed. Write them down and bring it to your next appointment.  ? ?Let us know if you need anything. ?

## 2022-05-18 ENCOUNTER — Encounter: Payer: Self-pay | Admitting: Family Medicine

## 2022-05-18 ENCOUNTER — Ambulatory Visit (INDEPENDENT_AMBULATORY_CARE_PROVIDER_SITE_OTHER): Payer: 59 | Admitting: Family Medicine

## 2022-05-18 VITALS — BP 110/72 | HR 88 | Temp 97.9°F | Ht 76.0 in | Wt 259.0 lb

## 2022-05-18 DIAGNOSIS — R55 Syncope and collapse: Secondary | ICD-10-CM

## 2022-05-18 LAB — CBC
HCT: 41.9 % (ref 39.0–52.0)
Hemoglobin: 14.1 g/dL (ref 13.0–17.0)
MCHC: 33.6 g/dL (ref 30.0–36.0)
MCV: 87.8 fl (ref 78.0–100.0)
Platelets: 340 10*3/uL (ref 150.0–400.0)
RBC: 4.77 Mil/uL (ref 4.22–5.81)
RDW: 13.3 % (ref 11.5–15.5)
WBC: 6.9 10*3/uL (ref 4.0–10.5)

## 2022-05-18 LAB — COMPREHENSIVE METABOLIC PANEL
ALT: 27 U/L (ref 0–53)
AST: 19 U/L (ref 0–37)
Albumin: 4.6 g/dL (ref 3.5–5.2)
Alkaline Phosphatase: 61 U/L (ref 39–117)
BUN: 24 mg/dL — ABNORMAL HIGH (ref 6–23)
CO2: 31 mEq/L (ref 19–32)
Calcium: 9.7 mg/dL (ref 8.4–10.5)
Chloride: 101 mEq/L (ref 96–112)
Creatinine, Ser: 0.98 mg/dL (ref 0.40–1.50)
GFR: 86.11 mL/min (ref >60.00)
Glucose, Bld: 92 mg/dL (ref 70–99)
Potassium: 4.1 mEq/L (ref 3.5–5.1)
Sodium: 141 mEq/L (ref 135–145)
Total Bilirubin: 0.4 mg/dL (ref 0.2–1.2)
Total Protein: 7 g/dL (ref 6.0–8.3)

## 2022-05-18 NOTE — Patient Instructions (Addendum)
Stop the glipizide. We are staying on the Trulicity.   Keep the diet clean and stay active.  Give [place] 2-3 business days to get the results of your labs back.   Send me a message or call in 2 days with your sugar readings and if you are feeling better or not.   Let us know if you need anything.

## 2022-05-18 NOTE — Progress Notes (Signed)
Chief Complaint  Patient presents with   Dizziness    Left arm numbness/no feeling in finger tips     Subjective: Patient is a 56 y.o. male here for lightheadedness.  2 days ago, the patient turned around to hand someone a receipt and he felt his vision go dark and his body get weak.  He had associated left hand weakness and dropped the receipt.  He went to lean against a wall for around 10 minutes and drank a [employer].  He felt a bit better and then went home.  The same thing happened that evening as he bent over.  He called paramedics and his sugar was 114.  His last A1c was 13.5.  He has been taking glipizide 5 mg twice daily in addition to Trulicity 1.5 mg weekly.  He is significantly improved his diet over the past 3 months.  He does not check his sugars routinely.  He has no residual weakness in his upper extremities or lower extremities.  He has no balance issues, difficulty swallowing, trouble with speech, or vision changes.  Past Medical History:  Diagnosis Date   Allergy    Diabetes mellitus without complication (HCC)    Hypertension     Objective: BP 110/72   Pulse 88   Temp 97.9 F (36.6 C) (Oral)   Ht '6\' 4"'$  (1.93 m)   Wt 259 lb (117.5 kg)   SpO2 98%   BMI 31.53 kg/m  General: Awake, appears stated age Eyes: PERRLA Heart: RRR, no LE edema, no bruits Lungs: CTAB, no rales, wheezes or rhonchi. No accessory muscle use Neuro: Decreased sensation to pinprick over b/l feet forefoot and beyond; 5/5 strength throughout, DTRs equal and symmetric throughout, no clonus, no cerebellar signs, gait is normal; cranial nerves II through XII grossly intact Skin: No external lesions noted Psych: Age appropriate judgment and insight, normal affect and mood  Assessment and Plan: Near syncope - Plan: CBC, Comprehensive metabolic panel  New problem with uncertain prognosis.  Check above labs.  Since he does not check his sugars at home, unsure if he had a stroke or hypoglycemic  episode.  We will treat for the latter.  Monitor sugars at home.  Stop glipizide.  Continue Trulicity 1.5 mg weekly.  His diet is significantly improved, this was encouraged.  He will send a message in 2 days with his blood sugar readings in addition to how he is feeling.  If he is no better, I will check an MRI of his brain to rule out a CVA.  I will see him as originally scheduled. The patient voiced understanding and agreement to the plan.  [place], DO 05/18/22  11:53 AM

## 2022-05-22 ENCOUNTER — Encounter: Payer: Self-pay | Admitting: Family Medicine

## 2022-05-24 ENCOUNTER — Other Ambulatory Visit: Payer: Self-pay | Admitting: Family Medicine

## 2022-05-24 MED ORDER — PIOGLITAZONE HCL 30 MG PO TABS: 30.0000 mg | ORAL_TABLET | Freq: Every day | ORAL | 0 refills | Status: DC

## 2022-07-05 ENCOUNTER — Ambulatory Visit (INDEPENDENT_AMBULATORY_CARE_PROVIDER_SITE_OTHER): Payer: 59 | Admitting: Family Medicine

## 2022-07-05 ENCOUNTER — Encounter: Payer: Self-pay | Admitting: Family Medicine

## 2022-07-05 VITALS — BP 120/74 | HR 92 | Temp 98.3°F | Ht 76.0 in | Wt 259.2 lb

## 2022-07-05 DIAGNOSIS — E1165 Type 2 diabetes mellitus with hyperglycemia: Secondary | ICD-10-CM | POA: Diagnosis not present

## 2022-07-05 DIAGNOSIS — Z23 Encounter for immunization: Secondary | ICD-10-CM

## 2022-07-05 DIAGNOSIS — I10 Essential (primary) hypertension: Secondary | ICD-10-CM

## 2022-07-05 LAB — MICROALBUMIN / CREATININE URINE RATIO
Creatinine,U: 235.8 mg/dL
Microalb Creat Ratio: 1.1 mg/g (ref 0.0–30.0)
Microalb, Ur: 2.6 mg/dL — ABNORMAL HIGH (ref 0.0–1.9)

## 2022-07-05 LAB — LIPID PANEL
Cholesterol: 202 mg/dL — ABNORMAL HIGH (ref 0–200)
HDL: 40.6 mg/dL (ref >39.00)
LDL Cholesterol: 126 mg/dL — ABNORMAL HIGH (ref 0–99)
NonHDL: 161.39
Total CHOL/HDL Ratio: 5
Triglycerides: 175 mg/dL — ABNORMAL HIGH (ref 0.0–149.0)
VLDL: 35 mg/dL (ref 0.0–40.0)

## 2022-07-05 LAB — COMPREHENSIVE METABOLIC PANEL
ALT: 27 U/L (ref 0–53)
AST: 17 U/L (ref 0–37)
Albumin: 4.8 g/dL (ref 3.5–5.2)
Alkaline Phosphatase: 49 U/L (ref 39–117)
BUN: 26 mg/dL — ABNORMAL HIGH (ref 6–23)
CO2: 30 mEq/L (ref 19–32)
Calcium: 9.9 mg/dL (ref 8.4–10.5)
Chloride: 102 mEq/L (ref 96–112)
Creatinine, Ser: 0.98 mg/dL (ref 0.40–1.50)
GFR: 86.03 mL/min (ref >60.00)
Glucose, Bld: 137 mg/dL — ABNORMAL HIGH (ref 70–99)
Potassium: 4.2 mEq/L (ref 3.5–5.1)
Sodium: 141 mEq/L (ref 135–145)
Total Bilirubin: 0.5 mg/dL (ref 0.2–1.2)
Total Protein: 7 g/dL (ref 6.0–8.3)

## 2022-07-05 LAB — HEMOGLOBIN A1C: Hgb A1c MFr Bld: 7.6 % — ABNORMAL HIGH (ref 4.6–6.5)

## 2022-07-05 NOTE — Progress Notes (Signed)
Subjective:   Chief Complaint  Patient presents with   Follow-up    Has not started the new medication.    William Adkins is a 56 y.o. male here for follow-up of diabetes.   William Adkins's self monitored glucose range is low 100's.  Patient denies hypoglycemic reactions. He checks his glucose levels a few times per week Patient does not require insulin.   Medications include: Trulicity 1.5  Diet is healthy.  Exercise: none currently No CP or SOB.   Hypertension Patient presents for hypertension follow up. He does not monitor home blood pressures. He is compliant with medications- olmesartan 40 mg/d, chlorthalidone 25 mg/d, Norvasc 10 mg/d.  Patient has these side effects of medication: none Diet/exercise as above.   Past Medical History:  Diagnosis Date   Allergy    Diabetes mellitus without complication ([place])    Hypertension      Related testing: Retinal exam: Done Pneumovax: done  Objective:  BP 120/74   Pulse 92   Temp 98.3 F (36.8 C) (Oral)   Ht '6\' 4"'$  (1.93 m)   Wt 259 lb 4 oz (117.6 kg)   SpO2 94%   BMI 31.56 kg/m  General:  Well developed, well nourished, in no apparent distress Lungs:  CTAB, no access msc use Cardio:  RRR, no bruits, no LE edema Psych: Age appropriate judgment and insight  Assessment:   Type 2 diabetes mellitus with hyperglycemia, without long-term current use of insulin (HCC) - Plan: Microalbumin / creatinine urine ratio, Hemoglobin A1c, Lipid panel, Comprehensive metabolic panel  Essential hypertension  Need for Tdap vaccination - Plan: Tdap vaccine greater than or equal to 7yo IM   Plan:   Chronic, unsure if controlled.  Diet is much better, sugars seem within range.  Continue Trulicity 1.5 mg weekly.  Counseled on diet and exercise. Chronic, stable.  Continue Norvasc 10 mg daily, chlorthalidone 25 mg daily, olmesartan 40 mg daily. Tdap today, Shingrix recommended. F/u in 3-6 mo pending above. The patient voiced  understanding and agreement to the plan.  [place], DO 07/05/22 12:07 PM

## 2022-07-05 NOTE — Patient Instructions (Addendum)
Give [place] 2-3 business days to get the results of your labs back.   Keep the diet clean and stay active.  Continue to monitor your sugars at home.  The Shingrix vaccine (for shingles) is a 2 shot series spaced 2-6 months apart. It can make people feel low energy, achy and almost like they have the flu for 48 hours after injection. 1/5 people can have nausea and/or vomiting. Please plan accordingly when deciding on when to get this shot. Call our office for a nurse visit appointment to get this. The second shot of the series is less severe regarding the side effects, but it still lasts 48 hours.   Let us know if you need anything.

## 2022-07-12 ENCOUNTER — Telehealth: Payer: Self-pay | Admitting: *Deleted

## 2022-07-12 ENCOUNTER — Ambulatory Visit (INDEPENDENT_AMBULATORY_CARE_PROVIDER_SITE_OTHER): Payer: 59 | Admitting: Family

## 2022-07-12 ENCOUNTER — Telehealth: Payer: Self-pay | Admitting: Family

## 2022-07-12 ENCOUNTER — Encounter: Payer: Self-pay | Admitting: Family

## 2022-07-12 VITALS — BP 94/55 | HR 107 | Temp 98.6°F | Resp 16 | Ht 76.0 in | Wt 249.0 lb

## 2022-07-12 DIAGNOSIS — I10 Essential (primary) hypertension: Secondary | ICD-10-CM | POA: Diagnosis not present

## 2022-07-12 DIAGNOSIS — R197 Diarrhea, unspecified: Secondary | ICD-10-CM | POA: Insufficient documentation

## 2022-07-12 LAB — CBC WITH DIFFERENTIAL/PLATELET
Basophils Absolute: 0 10*3/uL (ref 0.0–0.1)
Basophils Relative: 0.6 % (ref 0.0–3.0)
Eosinophils Absolute: 0.3 10*3/uL (ref 0.0–0.7)
Eosinophils Relative: 3.6 % (ref 0.0–5.0)
HCT: 47.4 % (ref 39.0–52.0)
Hemoglobin: 15.7 g/dL (ref 13.0–17.0)
Lymphocytes Relative: 16.9 % (ref 12.0–46.0)
Lymphs Abs: 1.2 10*3/uL (ref 0.7–4.0)
MCHC: 33.1 g/dL (ref 30.0–36.0)
MCV: 88.6 fl (ref 78.0–100.0)
Monocytes Absolute: 0.8 10*3/uL (ref 0.1–1.0)
Monocytes Relative: 12 % (ref 3.0–12.0)
Neutro Abs: 4.6 10*3/uL (ref 1.4–7.7)
Neutrophils Relative %: 66.9 % (ref 43.0–77.0)
Platelets: 310 10*3/uL (ref 150.0–400.0)
RBC: 5.35 Mil/uL (ref 4.22–5.81)
RDW: 14 % (ref 11.5–15.5)
WBC: 6.9 10*3/uL (ref 4.0–10.5)

## 2022-07-12 LAB — COMPREHENSIVE METABOLIC PANEL
ALT: 20 U/L (ref 0–53)
AST: 22 U/L (ref 0–37)
Albumin: 4.8 g/dL (ref 3.5–5.2)
Alkaline Phosphatase: 62 U/L (ref 39–117)
BUN: 90 mg/dL (ref 6–23)
CO2: 13 mEq/L — ABNORMAL LOW (ref 19–32)
Calcium: 9.3 mg/dL (ref 8.4–10.5)
Chloride: 101 mEq/L (ref 96–112)
Creatinine, Ser: 2.93 mg/dL — ABNORMAL HIGH (ref 0.40–1.50)
GFR: 23.11 mL/min — ABNORMAL LOW (ref >60.00)
Glucose, Bld: 161 mg/dL — ABNORMAL HIGH (ref 70–99)
Potassium: 4.1 mEq/L (ref 3.5–5.1)
Sodium: 129 mEq/L — ABNORMAL LOW (ref 135–145)
Total Bilirubin: 0.4 mg/dL (ref 0.2–1.2)
Total Protein: 7.3 g/dL (ref 6.0–8.3)

## 2022-07-12 NOTE — Assessment & Plan Note (Signed)
BP is a bit soft in setting of dehydration. Pt is advised as follows:  Do not take blood pressure medication tonight.  Recheck blood pressure tomorrow AM.  If blood pressure is <85 (top number) tomorrow then please go to the ER. Tomorrow only take your blood pressure medication if the top number is 120 or greater.

## 2022-07-12 NOTE — Progress Notes (Signed)
Subjective:     Patient ID: William Adkins, male    DOB: May 31, 1966, 56 y.o.   MRN: [id number]  Chief Complaint  Patient presents with   Diarrhea    Patient complains of diarrhea for about 5 days. Imodium not helping.     HPI Patient is in today to discuss diarrhea.  Reports that diarrhea began about 5 days ago.  Reports that he is having about 5-6 watery stools/day.  No recent travel or known sick contacts. Denies fever.  Reports some abdominal cramping.  He has been using imodium and pepto-bismol.  He has not vomited but he has had some nausea. He is tolerating fluids.  He did take his blood pressure medication last night but has not taken any today.     Health Maintenance Due  Topic Date Due   HIV Screening  Never done   Hepatitis C Screening  Never done    Past Medical History:  Diagnosis Date   Allergy    Diabetes mellitus without complication ([place])    Hypertension     Past Surgical History:  Procedure Laterality Date   colonoscopy     SHOULDER SURGERY  1998   right shoulder states "it would disclocate a lot; had to stretch tendon  and staple it to the top"    Family History  Problem Relation Age of Onset   Diabetes Mother    Heart disease Father    Hypertension Father    Diabetes Father    Hypertension Brother    Heart disease Brother    Diabetes Brother    Heart attack Brother    Colon cancer Paternal Aunt    Prostate cancer Neg Hx     Social History   Socioeconomic History   Marital status: Married    Spouse name: Not on file   Number of children: Not on file   Years of education: Not on file   Highest education level: Not on file  Occupational History   Not on file  Tobacco Use   Smoking status: Former    Packs/day: 1.00    Types: Cigarettes    Start date: 06/02/2003    Quit date: 06/01/2014    Years since quitting: 8.1   Smokeless tobacco: Current    Types: Chew  Substance and Sexual Activity   Alcohol use: Yes    Comment:  occasionally    Drug use: No   Sexual activity: Yes    Partners: Female  Other Topics Concern   Not on file  Social History Narrative   Not on file   Social Determinants of Health   Financial Resource Strain: Not on file  Food Insecurity: Not on file  Transportation Needs: Not on file  Physical Activity: Not on file  Stress: Not on file  Social Connections: Not on file  Intimate Partner Violence: Not on file    Outpatient Medications Prior to Visit  Medication Sig Dispense Refill   amLODipine (NORVASC) 10 MG tablet Take 10 mg by mouth daily.     chlorthalidone (HYGROTON) 25 MG tablet Take 25 mg by mouth daily.     Dulaglutide (TRULICITY) 1.5 TK/2.4OX SOPN Inject 1.5 mg into the skin once a week. 4 mL 5   esomeprazole (NEXIUM) 20 MG capsule Take 20 mg by mouth daily at 12 noon.     ibuprofen (ADVIL,MOTRIN) 200 MG tablet Take 400-600 mg by mouth 3 (three) times daily as needed for mild pain.     montelukast (SINGULAIR)  10 MG tablet Take 1 tablet (10 mg total) by mouth at bedtime. 90 tablet 1   Multiple Vitamin (MULTIVITAMIN WITH MINERALS) TABS tablet Take 1 tablet by mouth daily.     Multiple Vitamins-Minerals (ONE-A-DAY 50 PLUS PO) Take 1 tablet by mouth daily.     olmesartan (BENICAR) 40 MG tablet Take 40 mg by mouth daily.     pravastatin (PRAVACHOL) 80 MG tablet Take 80 mg by mouth daily.     Probiotic Product (PROBIOTIC ADVANCED PO) Take 1 capsule by mouth daily.     No facility-administered medications prior to visit.    No Known Allergies  ROS    See HPI Objective:    Physical Exam Constitutional:      General: He is not in acute distress.    Appearance: He is well-developed. He is ill-appearing. He is not toxic-appearing or diaphoretic.  HENT:     Head: Normocephalic and atraumatic.  Cardiovascular:     Rate and Rhythm: Normal rate and regular rhythm.     Heart sounds: No murmur heard. Pulmonary:     Effort: Pulmonary effort is normal. No respiratory  distress.     Breath sounds: Normal breath sounds. No wheezing or rales.  Abdominal:     General: Bowel sounds are increased. There is no distension.     Palpations: Abdomen is soft.     Tenderness: There is generalized abdominal tenderness (mild). There is no guarding or rebound.  Skin:    General: Skin is warm and dry.  Neurological:     Mental Status: He is alert and oriented to person, place, and time.  Psychiatric:        Behavior: Behavior normal.        Thought Content: Thought content normal.     BP (!) 94/55   Pulse (!) 107   Temp 98.6 F (37 C) (Oral)   Resp 16   Ht $R'6\' 4"'mK$  (1.93 m)   Wt 249 lb (112.9 kg)   SpO2 98%   BMI 30.31 kg/m  Wt Readings from Last 3 Encounters:  07/12/22 249 lb (112.9 kg)  07/05/22 259 lb 4 oz (117.6 kg)  05/18/22 259 lb (117.5 kg)       Assessment & Plan:   Problem List Items Addressed This Visit       Unprioritized   Essential hypertension    BP is a bit soft in setting of dehydration. Pt is advised as follows:  Do not take blood pressure medication tonight.  Recheck blood pressure tomorrow AM.  If blood pressure is <85 (top number) tomorrow then please go to the ER. Tomorrow only take your blood pressure medication if the top number is 120 or greater.        Diarrhea of presumed infectious origin - Primary    New.  Pt is tolerating PO's. We discussed importance of really pushing his fluids today including gatorade/pedialyte. Continue imodium as needed. Go to the ER if you develop worsening abdominal pain, increased weakness, or blood pressure <85 on the top.  Stool studies as below.      Relevant Orders   Comp Met (CMET) (Completed)   CBC with Differential/Platelet (Completed)   Clostridium Difficile by PCR([facility])   Stool Culture   Addendum:  received lab work back indicating acute renal failure. Pt is advised to go to the ER now for IV fluids.  See phone note for details.   I am having Jazir Cheese  maintain his amLODipine, ibuprofen, multivitamin  with minerals, pravastatin, olmesartan, chlorthalidone, Probiotic Product (PROBIOTIC ADVANCED PO), Multiple Vitamins-Minerals (ONE-A-DAY 50 PLUS PO), esomeprazole, montelukast, and Trulicity.  No orders of the defined types were placed in this encounter.

## 2022-07-12 NOTE — Telephone Encounter (Signed)
Reviewed results showing hyponatremia/acute renal failure. Contacted pt on cell- no answer. Left detailed message advising pt on results and need to go to the ER for IV fluids.  I also tried his wife and got her voicemail. Left message for her to have her husband check his voicemail.

## 2022-07-12 NOTE — Telephone Encounter (Signed)
CRITICAL VALUE STICKER  CRITICAL VALUE: BUN 90  MESSENGER (representative from lab): Hope  MD NOTIFIED:   TIME OF NOTIFICATION: 425pm

## 2022-07-12 NOTE — Patient Instructions (Signed)
Do not take blood pressure medication tonight.  Recheck blood pressure tomorrow AM.  If blood pressure is <85 (top number) tomorrow then please go to the ER. Push fluids today including gatorade/pedialyte. Continue imodium as needed. Go to the ER if you develop worsening abdominal pain, increased weakness, or blood pressure <85 on the top.  Tomorrow only take your blood pressure medication if the top number is 120 or greater.

## 2022-07-12 NOTE — Telephone Encounter (Signed)
Called pt- he has spoken to [place] and is on his way to the ER

## 2022-07-12 NOTE — Telephone Encounter (Signed)
Confirmed with pt that he received my message and that he is planning to go to the ER.

## 2022-07-12 NOTE — Assessment & Plan Note (Signed)
New.  Pt is tolerating PO's. We discussed importance of really pushing his fluids today including gatorade/pedialyte. Continue imodium as needed. Go to the ER if you develop worsening abdominal pain, increased weakness, or blood pressure <85 on the top.  Stool studies as below.

## 2022-07-13 ENCOUNTER — Emergency Department (HOSPITAL_BASED_OUTPATIENT_CLINIC_OR_DEPARTMENT_OTHER)
Admission: EM | Admit: 2022-07-13 | Discharge: 2022-07-13 | Disposition: A | Discharge status: Home / Self Care | Payer: 59 | Attending: Emergency Medicine | Admitting: Emergency Medicine

## 2022-07-13 ENCOUNTER — Encounter (HOSPITAL_BASED_OUTPATIENT_CLINIC_OR_DEPARTMENT_OTHER): Payer: Self-pay | Admitting: Emergency Medicine

## 2022-07-13 ENCOUNTER — Other Ambulatory Visit: Payer: 59

## 2022-07-13 ENCOUNTER — Other Ambulatory Visit: Payer: Self-pay

## 2022-07-13 DIAGNOSIS — R197 Diarrhea, unspecified: Secondary | ICD-10-CM

## 2022-07-13 DIAGNOSIS — R000 Tachycardia, unspecified: Secondary | ICD-10-CM | POA: Diagnosis not present

## 2022-07-13 DIAGNOSIS — N179 Acute kidney failure, unspecified: Secondary | ICD-10-CM

## 2022-07-13 LAB — CBC WITH DIFFERENTIAL/PLATELET
Abs Immature Granulocytes: 0.02 10*3/uL (ref 0.00–0.07)
Basophils Absolute: 0 10*3/uL (ref 0.0–0.1)
Basophils Relative: 0 %
Eosinophils Absolute: 0.2 10*3/uL (ref 0.0–0.5)
Eosinophils Relative: 3 %
HCT: 50.6 % (ref 39.0–52.0)
Hemoglobin: 17.1 g/dL — ABNORMAL HIGH (ref 13.0–17.0)
Immature Granulocytes: 0 %
Lymphocytes Relative: 20 %
Lymphs Abs: 1.6 10*3/uL (ref 0.7–4.0)
MCH: 29 pg (ref 26.0–34.0)
MCHC: 33.8 g/dL (ref 30.0–36.0)
MCV: 85.8 fL (ref 80.0–100.0)
Monocytes Absolute: 1 10*3/uL (ref 0.1–1.0)
Monocytes Relative: 12 %
Neutro Abs: 5.1 10*3/uL (ref 1.7–7.7)
Neutrophils Relative %: 65 %
Platelets: 350 10*3/uL (ref 150–400)
RBC: 5.9 MIL/uL — ABNORMAL HIGH (ref 4.22–5.81)
RDW: 13 % (ref 11.5–15.5)
WBC: 7.9 10*3/uL (ref 4.0–10.5)
nRBC: 0 % (ref 0.0–0.2)

## 2022-07-13 LAB — COMPREHENSIVE METABOLIC PANEL
ALT: 17 U/L (ref 0–44)
AST: 19 U/L (ref 15–41)
Albumin: 5.1 g/dL — ABNORMAL HIGH (ref 3.5–5.0)
Alkaline Phosphatase: 62 U/L (ref 38–126)
Anion gap: 13 (ref 5–15)
BUN: 80 mg/dL — ABNORMAL HIGH (ref 6–20)
CO2: 18 mmol/L — ABNORMAL LOW (ref 22–32)
Calcium: 9.4 mg/dL (ref 8.9–10.3)
Chloride: 102 mmol/L (ref 98–111)
Creatinine, Ser: 2.16 mg/dL — ABNORMAL HIGH (ref 0.61–1.24)
GFR, Estimated: 35 mL/min — ABNORMAL LOW (ref >60)
Glucose, Bld: 256 mg/dL — ABNORMAL HIGH (ref 70–99)
Potassium: 3.8 mmol/L (ref 3.5–5.1)
Sodium: 133 mmol/L — ABNORMAL LOW (ref 135–145)
Total Bilirubin: 0.4 mg/dL (ref 0.3–1.2)
Total Protein: 7.9 g/dL (ref 6.5–8.1)

## 2022-07-13 LAB — LIPASE, BLOOD: Lipase: 162 U/L — ABNORMAL HIGH (ref 11–51)

## 2022-07-13 MED ORDER — LACTATED RINGERS IV BOLUS: 1000.0000 mL | Freq: Once | INTRAVENOUS | Status: DC

## 2022-07-13 MED ORDER — AZITHROMYCIN 250 MG PO TABS: 500.0000 mg | ORAL_TABLET | Freq: Every day | ORAL | 0 refills | Status: AC

## 2022-07-13 MED ORDER — MORPHINE SULFATE (PF) 4 MG/ML IV SOLN: 4.0000 mg | Freq: Once | INTRAVENOUS | Status: DC

## 2022-07-13 MED ORDER — LACTATED RINGERS IV BOLUS
1000.0000 mL | Freq: Once | INTRAVENOUS | Status: AC
  Administered 2022-07-13: 1000 mL via INTRAVENOUS

## 2022-07-13 MED ORDER — ONDANSETRON HCL 4 MG/2ML IJ SOLN: 4.0000 mg | Freq: Once | INTRAMUSCULAR | Status: DC

## 2022-07-13 NOTE — Discharge Instructions (Addendum)
Follow up with your doctor in the office.  Typically imodium is ok for diarrheal illness.  I have started you on antibiotics in case this is due to a bacteria.  Please return for abdominal pain, fever, inability to eat or drink.  Your kidney function is worse than baseline.  Follow up with your doctor in the office for recheck hopefully in a week or two.   Your lipase level was elevated. Sometimes this can mean you have pancreatitis.  Have

## 2022-07-13 NOTE — ED Triage Notes (Signed)
Weakness, nausea and diarrhea x 1 week Seen by PCP yesterday - was told to come to ED for evaluation of kidney function.

## 2022-07-13 NOTE — ED Provider Notes (Signed)
[facility] EMERGENCY DEPARTMENT Provider Note   CSN: [id number] Arrival date & time: 07/13/22  1336     History  Chief Complaint  Patient presents with   Diarrhea   Abnormal Lab    William Adkins is a 56 y.o. male.  56 yo M with a chief complaints of diarrhea.  This been going on for about 5 days now.  The patient went to see his family physician yesterday and was called back to the emergency department after he was found to have an acute kidney injury.  He has been able to drink quite a bit of fluid since.  Denies any nausea.  Has some diffuse abdominal cramping.  He denies any suspicious food or water intake.  Denies recent travel.  His stools been a bit dark especially after taking Pepto but denies gross blood.  No fevers.   Diarrhea Abnormal Lab      Home Medications Prior to Admission medications   Medication Sig Start Date End Date Taking? Authorizing Provider  azithromycin (ZITHROMAX) 250 MG tablet Take 2 tablets (500 mg total) by mouth daily for 3 days. Take first 2 tablets together, then 1 every day until finished. 07/13/22 07/16/22 Yes Deno Etienne, DO  amLODipine (NORVASC) 10 MG tablet Take 10 mg by mouth daily. 04/04/17   [provider]  chlorthalidone (HYGROTON) 25 MG tablet Take 25 mg by mouth daily.    [provider]  Dulaglutide (TRULICITY) 1.5 XT/0.2IO SOPN Inject 1.5 mg into the skin once a week. 05/03/22   Shelda Pal, DO  esomeprazole (NEXIUM) 20 MG capsule Take 20 mg by mouth daily at 12 noon.    [provider]  ibuprofen (ADVIL,MOTRIN) 200 MG tablet Take 400-600 mg by mouth 3 (three) times daily as needed for mild pain.    [provider]  montelukast (SINGULAIR) 10 MG tablet Take 1 tablet (10 mg total) by mouth at bedtime. 05/03/22   Shelda Pal, DO  Multiple Vitamin (MULTIVITAMIN WITH MINERALS) TABS tablet Take 1 tablet by mouth daily.    [provider]  Multiple  Vitamins-Minerals (ONE-A-DAY 50 PLUS PO) Take 1 tablet by mouth daily.    [provider]  olmesartan (BENICAR) 40 MG tablet Take 40 mg by mouth daily.    [provider]  pravastatin (PRAVACHOL) 80 MG tablet Take 80 mg by mouth daily.    [provider]  Probiotic Product (PROBIOTIC ADVANCED PO) Take 1 capsule by mouth daily.    [provider]      Allergies    Patient has no known allergies.    Review of Systems   Review of Systems  Gastrointestinal:  Positive for diarrhea.    Physical Exam Updated Vital Signs BP 135/84 (BP Location: Right Arm)   Pulse 100   Temp 98.4 F (36.9 C)   Resp 18   Ht '6\' 4"'$  (1.93 m)   Wt 113 kg   SpO2 100%   BMI 30.32 kg/m  Physical Exam Vitals and nursing note reviewed.  Constitutional:      Appearance: He is well-developed.  HENT:     Head: Normocephalic and atraumatic.  Eyes:     Pupils: Pupils are equal, round, and reactive to light.  Neck:     Vascular: No JVD.  Cardiovascular:     Rate and Rhythm: Regular rhythm. Tachycardia present.     Heart sounds: No murmur heard.    No friction rub. No gallop.  Pulmonary:  Effort: No respiratory distress.     Breath sounds: No wheezing.  Abdominal:     General: There is no distension.     Tenderness: There is no abdominal tenderness. There is no guarding or rebound.  Musculoskeletal:        General: Normal range of motion.     Cervical back: Normal range of motion and neck supple.  Skin:    Coloration: Skin is not pale.     Findings: No rash.  Neurological:     Mental Status: He is alert and oriented to person, place, and time.  Psychiatric:        Behavior: Behavior normal.     ED Results / Procedures / Treatments   Labs (all labs ordered are listed, but only abnormal results are displayed) Labs Reviewed  LIPASE, BLOOD - Abnormal; Notable for the following components:      Result Value   Lipase 162 (*)    All other components within  normal limits  COMPREHENSIVE METABOLIC PANEL - Abnormal; Notable for the following components:   Sodium 133 (*)    CO2 18 (*)    Glucose, Bld 256 (*)    BUN 80 (*)    Creatinine, Ser 2.16 (*)    Albumin 5.1 (*)    GFR, Estimated 35 (*)    All other components within normal limits  CBC WITH DIFFERENTIAL/PLATELET - Abnormal; Notable for the following components:   RBC 5.90 (*)    Hemoglobin 17.1 (*)    All other components within normal limits  GASTROINTESTINAL PANEL BY PCR, STOOL (REPLACES STOOL CULTURE)  URINALYSIS, ROUTINE W REFLEX MICROSCOPIC    EKG None  Radiology No results found.  Procedures Procedures    Medications Ordered in ED Medications  lactated ringers bolus 1,000 mL (has no administration in time range)  morphine (PF) 4 MG/ML injection 4 mg (4 mg Intravenous Patient Refused/Not Given 07/13/22 1403)  ondansetron (ZOFRAN) injection 4 mg (4 mg Intravenous Patient Refused/Not Given 07/13/22 1404)  lactated ringers bolus 1,000 mL (0 mLs Intravenous Stopped 07/13/22 1524)    ED Course/ Medical Decision Making/ A&P                           Medical Decision Making Amount and/or Complexity of Data Reviewed Labs: ordered.  Risk Prescription drug management.   56 yo M with a chief complaints of diarrhea.  Going on for about 5 days now.  He denies any suspicious food or water intake but tells me he has changed his diet somewhat drastically after being diagnosed with diabetes.  He is on Trulicity for diabetes.  Will give 2 L of fluids.  Recheck labs.  Nausea and pain medicine.  Reassess.  Lab work improved from yesterday.  The patient is feeling a bit better after liter of IV fluids.  We will give him a second liter.  Have him follow-up with his family doctor in the office.  Continue to push fluids at home.  We will start on azithromycin for possible infectious diarrhea.  We will send off stool samples if able.  3:33 PM:  I have discussed the  diagnosis/risks/treatment options with the patient.  Evaluation and diagnostic testing in the emergency department does not suggest an emergent condition requiring admission or immediate intervention beyond what has been performed at this time.  They will follow up with  PCP. We also discussed returning to the ED immediately if new or worsening  sx occur. We discussed the sx which are most concerning (e.g., sudden worsening pain, fever, inability to tolerate by mouth) that necessitate immediate return. Medications administered to the patient during their visit and any new prescriptions provided to the patient are listed below.  Medications given during this visit Medications  lactated ringers bolus 1,000 mL (has no administration in time range)  morphine (PF) 4 MG/ML injection 4 mg (4 mg Intravenous Patient Refused/Not Given 07/13/22 1403)  ondansetron (ZOFRAN) injection 4 mg (4 mg Intravenous Patient Refused/Not Given 07/13/22 1404)  lactated ringers bolus 1,000 mL (0 mLs Intravenous Stopped 07/13/22 1524)     The patient appears reasonably screen and/or stabilized for discharge and I doubt any other medical condition or other [facility] requiring further screening, evaluation, or treatment in the ED at this time prior to discharge.          Final Clinical Impression(s) / ED Diagnoses Final diagnoses:  AKI (acute kidney injury) ([place])  Diarrhea of presumed infectious origin    Rx / DC Orders ED Discharge Orders          Ordered    azithromycin (ZITHROMAX) 250 MG tablet  Daily        07/13/22 [place], Maday Guarino, DO 07/13/22 1533

## 2022-07-15 LAB — CLOSTRIDIUM DIFFICILE BY PCR: Toxigenic C. Difficile by PCR: NEGATIVE

## 2022-07-17 LAB — STOOL CULTURE: E coli, Shiga toxin Assay: NEGATIVE

## 2022-07-19 ENCOUNTER — Ambulatory Visit (INDEPENDENT_AMBULATORY_CARE_PROVIDER_SITE_OTHER): Payer: 59 | Admitting: Family

## 2022-07-19 VITALS — BP 114/83 | HR 82 | Temp 99.2°F | Resp 16 | Wt 265.0 lb

## 2022-07-19 DIAGNOSIS — N179 Acute kidney failure, unspecified: Secondary | ICD-10-CM | POA: Diagnosis not present

## 2022-07-19 DIAGNOSIS — E785 Hyperlipidemia, unspecified: Secondary | ICD-10-CM | POA: Diagnosis not present

## 2022-07-19 DIAGNOSIS — K529 Noninfective gastroenteritis and colitis, unspecified: Secondary | ICD-10-CM | POA: Diagnosis not present

## 2022-07-19 DIAGNOSIS — I10 Essential (primary) hypertension: Secondary | ICD-10-CM

## 2022-07-19 LAB — BASIC METABOLIC PANEL
BUN: 27 mg/dL — ABNORMAL HIGH (ref 6–23)
CO2: 29 mEq/L (ref 19–32)
Calcium: 9.5 mg/dL (ref 8.4–10.5)
Chloride: 102 mEq/L (ref 96–112)
Creatinine, Ser: 1.1 mg/dL (ref 0.40–1.50)
GFR: 74.88 mL/min (ref >60.00)
Glucose, Bld: 213 mg/dL — ABNORMAL HIGH (ref 70–99)
Potassium: 4.3 mEq/L (ref 3.5–5.1)
Sodium: 140 mEq/L (ref 135–145)

## 2022-07-19 MED ORDER — OLMESARTAN MEDOXOMIL 40 MG PO TABS
40.0000 mg | ORAL_TABLET | Freq: Every day | ORAL | 1 refills | Status: DC
Start: 2022-07-19 — End: 2022-10-20

## 2022-07-19 MED ORDER — CHLORTHALIDONE 25 MG PO TABS
25.0000 mg | ORAL_TABLET | Freq: Every day | ORAL | 1 refills | Status: DC
Start: 2022-07-19 — End: 2022-10-20

## 2022-07-19 MED ORDER — PRAVASTATIN SODIUM 80 MG PO TABS
80.0000 mg | ORAL_TABLET | Freq: Every day | ORAL | 1 refills | Status: DC
Start: 2022-07-19 — End: 2022-10-20

## 2022-07-19 MED ORDER — AMLODIPINE BESYLATE 10 MG PO TABS: 10.0000 mg | ORAL_TABLET | Freq: Every day | ORAL | 1 refills | Status: DC

## 2022-07-19 NOTE — Progress Notes (Signed)
Subjective:   By signing my name below, I, [name], attest that this documentation has been prepared under the direction and in the presence of [name], NP 07/19/2022    Patient ID: William Adkins, male    DOB: 1966/07/03, 56 y.o.   MRN: [id number]  Chief Complaint  Patient presents with   Diarrhea    Here for follow up, "diarrhea has stopped"   Follow-up    ER follow up     Patient is in today for a office visit. Patient was seen on 07/12/22 with diarrhea/weakness and found to be in acute renal failure. He was sent to the ED for further evaluation/IV fluids. He was prescribed azithromycin empirically.   Diarrhea- He reports feeling much better during this visit. His diarrhea stopped last week around Friday, 07/16/2022. He stopped all his medication last week on Friday and resumed it Sunday, 07/18/2022. He is requesting letter for his work showing he is fit to work.   Refills- He is requesting a refill for 10 mg amlodipine daily PO, 25 mg chlorthalidone daily PO, 40 mg benicar daily PO, 80 mg pravastatin daily PO.   Health Maintenance Due  Topic Date Due   HIV Screening  Never done   Hepatitis C Screening  Never done    Past Medical History:  Diagnosis Date   Allergy    Diabetes mellitus without complication ([place])    Hypertension     Past Surgical History:  Procedure Laterality Date   colonoscopy     SHOULDER SURGERY  1998   right shoulder states "it would disclocate a lot; had to stretch tendon  and staple it to the top"    Family History  Problem Relation Age of Onset   Diabetes Mother    Heart disease Father    Hypertension Father    Diabetes Father    Hypertension Brother    Heart disease Brother    Diabetes Brother    Heart attack Brother    Colon cancer Paternal Aunt    Prostate cancer Neg Hx     Social History   Socioeconomic History   Marital status: Married    Spouse name: Not on file   Number of children: Not on file    Years of education: Not on file   Highest education level: Not on file  Occupational History   Not on file  Tobacco Use   Smoking status: Former    Packs/day: 1.00    Types: Cigarettes    Start date: 06/02/2003    Quit date: 06/01/2014    Years since quitting: 8.1   Smokeless tobacco: Current    Types: Chew  Substance and Sexual Activity   Alcohol use: Yes    Comment: occasionally    Drug use: No   Sexual activity: Yes    Partners: Female  Other Topics Concern   Not on file  Social History Narrative   Not on file   Social Determinants of Health   Financial Resource Strain: Not on file  Food Insecurity: Not on file  Transportation Needs: Not on file  Physical Activity: Not on file  Stress: Not on file  Social Connections: Not on file  Intimate Partner Violence: Not on file    Outpatient Medications Prior to Visit  Medication Sig Dispense Refill   Dulaglutide (TRULICITY) 1.5 JI/9.6VE SOPN Inject 1.5 mg into the skin once a week. 4 mL 5   esomeprazole (NEXIUM) 20 MG capsule Take 20 mg by mouth  daily at 12 noon.     ibuprofen (ADVIL,MOTRIN) 200 MG tablet Take 400-600 mg by mouth 3 (three) times daily as needed for mild pain.     montelukast (SINGULAIR) 10 MG tablet Take 1 tablet (10 mg total) by mouth at bedtime. 90 tablet 1   Multiple Vitamin (MULTIVITAMIN WITH MINERALS) TABS tablet Take 1 tablet by mouth daily.     Multiple Vitamins-Minerals (ONE-A-DAY 50 PLUS PO) Take 1 tablet by mouth daily.     Probiotic Product (PROBIOTIC ADVANCED PO) Take 1 capsule by mouth daily.     amLODipine (NORVASC) 10 MG tablet Take 10 mg by mouth daily.     chlorthalidone (HYGROTON) 25 MG tablet Take 25 mg by mouth daily.     olmesartan (BENICAR) 40 MG tablet Take 40 mg by mouth daily.     pravastatin (PRAVACHOL) 80 MG tablet Take 80 mg by mouth daily.     No facility-administered medications prior to visit.    No Known Allergies  Review of Systems  Gastrointestinal:  Positive for  diarrhea.       Objective:    Physical Exam Constitutional:      General: He is not in acute distress.    Appearance: Normal appearance. He is not ill-appearing.  HENT:     Head: Normocephalic and atraumatic.     Right Ear: External ear normal.     Left Ear: External ear normal.  Eyes:     Extraocular Movements: Extraocular movements intact.     Pupils: Pupils are equal, round, and reactive to light.  Cardiovascular:     Rate and Rhythm: Normal rate and regular rhythm.     Heart sounds: Normal heart sounds. No murmur heard.    No gallop.  Pulmonary:     Effort: Pulmonary effort is normal. No respiratory distress.     Breath sounds: Normal breath sounds. No wheezing or rales.  Skin:    General: Skin is warm and dry.  Neurological:     Mental Status: He is alert and oriented to person, place, and time.  Psychiatric:        Judgment: Judgment normal.     BP 114/83 (BP Location: Right Arm, Patient Position: Sitting, Cuff Size: Large)   Pulse 82   Temp 99.2 F (37.3 C) (Oral)   Resp 16   Wt 265 lb (120.2 kg)   SpO2 99%   BMI 32.26 kg/m  Wt Readings from Last 3 Encounters:  07/19/22 265 lb (120.2 kg)  07/13/22 249 lb 1.9 oz (113 kg)  07/12/22 249 lb (112.9 kg)       Assessment & Plan:   Problem List Items Addressed This Visit       Unprioritized   Hyperlipidemia    Lab Results  Component Value Date   CHOL 202 (H) 07/05/2022   HDL 40.60 07/05/2022   LDLCALC 126 (H) 07/05/2022   TRIG 175.0 (H) 07/05/2022   CHOLHDL 5 07/05/2022  Tolerating statin, continue same.       Relevant Medications   pravastatin (PRAVACHOL) 80 MG tablet   amLODipine (NORVASC) 10 MG tablet   olmesartan (BENICAR) 40 MG tablet   chlorthalidone (HYGROTON) 25 MG tablet   Gastroenteritis    Clinically resolved. C. Diff testing and Stool cultures were negative.  He feels that the azithromycin he was given in the ER cleared up the diarrhea.      Essential hypertension    BP looks ok  back on antihypertensives.  Continue same.  BP Readings from Last 3 Encounters:  07/19/22 114/83  07/13/22 135/84  07/12/22 (!) 94/55         Relevant Medications   pravastatin (PRAVACHOL) 80 MG tablet   amLODipine (NORVASC) 10 MG tablet   olmesartan (BENICAR) 40 MG tablet   chlorthalidone (HYGROTON) 25 MG tablet   Acute renal failure (H[facility]) - Primary    Repeat bmet following hydration.  Anticipate that his creatinine will be back to baseline.       Relevant Orders   Basic metabolic panel     Meds ordered this encounter  Medications   pravastatin (PRAVACHOL) 80 MG tablet    Sig: Take 1 tablet (80 mg total) by mouth daily.    Dispense:  90 tablet    Refill:  1    Order Specific Question:   Supervising Provider    Answer:   Penni Homans A [[id number]]   amLODipine (NORVASC) 10 MG tablet    Sig: Take 1 tablet (10 mg total) by mouth daily.    Dispense:  90 tablet    Refill:  1    Order Specific Question:   Supervising Provider    Answer:   Penni Homans A [[id number]]   olmesartan (BENICAR) 40 MG tablet    Sig: Take 1 tablet (40 mg total) by mouth daily.    Dispense:  90 tablet    Refill:  1    Order Specific Question:   Supervising Provider    Answer:   Penni Homans A [[id number]]   chlorthalidone (HYGROTON) 25 MG tablet    Sig: Take 1 tablet (25 mg total) by mouth daily.    Dispense:  90 tablet    Refill:  1    Order Specific Question:   Supervising Provider    Answer:   Penni Homans A [[id number]]    I, Nance Pear, NP, personally preformed the services described in this documentation.  All medical record entries made by the scribe were at my direction and in my presence.  I have reviewed the chart and discharge instructions (if applicable) and agree that the record reflects my personal performance and is accurate and complete. 07/19/2022   I,[name],acting as a Education administrator for Nance Pear, NP.,have documented all relevant documentation on the behalf of Nance Pear, NP,as directed by  Nance Pear, NP while in the presence of Nance Pear, NP.   Nance Pear, NP

## 2022-07-19 NOTE — Assessment & Plan Note (Signed)
Lab Results  Component Value Date   CHOL 202 (H) 07/05/2022   HDL 40.60 07/05/2022   LDLCALC 126 (H) 07/05/2022   TRIG 175.0 (H) 07/05/2022   CHOLHDL 5 07/05/2022   Tolerating statin, continue same.

## 2022-07-19 NOTE — Assessment & Plan Note (Addendum)
Clinically resolved. C. Diff testing and Stool cultures were negative.  He feels that the azithromycin he was given in the ER cleared up the diarrhea.

## 2022-07-19 NOTE — Assessment & Plan Note (Signed)
BP looks ok back on antihypertensives.  Continue same.  BP Readings from Last 3 Encounters:  07/19/22 114/83  07/13/22 135/84  07/12/22 (!) 94/55

## 2022-07-19 NOTE — Assessment & Plan Note (Signed)
Repeat bmet following hydration.  Anticipate that his creatinine will be back to baseline.

## 2022-10-20 ENCOUNTER — Other Ambulatory Visit: Payer: Self-pay | Admitting: Family

## 2022-11-29 ENCOUNTER — Ambulatory Visit: Payer: 59 | Admitting: Family Medicine

## 2022-11-29 ENCOUNTER — Encounter: Payer: Self-pay | Admitting: Family Medicine

## 2022-11-29 ENCOUNTER — Ambulatory Visit (INDEPENDENT_AMBULATORY_CARE_PROVIDER_SITE_OTHER): Payer: 59 | Admitting: Family Medicine

## 2022-11-29 VITALS — BP 132/80 | HR 84 | Temp 98.8°F | Ht 76.0 in | Wt 262.2 lb

## 2022-11-29 DIAGNOSIS — Z125 Encounter for screening for malignant neoplasm of prostate: Secondary | ICD-10-CM

## 2022-11-29 DIAGNOSIS — Z0000 Encounter for general adult medical examination without abnormal findings: Secondary | ICD-10-CM

## 2022-11-29 DIAGNOSIS — Z23 Encounter for immunization: Secondary | ICD-10-CM

## 2022-11-29 DIAGNOSIS — E1165 Type 2 diabetes mellitus with hyperglycemia: Secondary | ICD-10-CM

## 2022-11-29 LAB — COMPREHENSIVE METABOLIC PANEL
ALT: 35 U/L (ref 0–53)
AST: 23 U/L (ref 0–37)
Albumin: 4.8 g/dL (ref 3.5–5.2)
Alkaline Phosphatase: 49 U/L (ref 39–117)
BUN: 28 mg/dL — ABNORMAL HIGH (ref 6–23)
CO2: 30 mEq/L (ref 19–32)
Calcium: 10 mg/dL (ref 8.4–10.5)
Chloride: 101 mEq/L (ref 96–112)
Creatinine, Ser: 1.1 mg/dL (ref 0.40–1.50)
GFR: 74.69 mL/min (ref >60.00)
Glucose, Bld: 152 mg/dL — ABNORMAL HIGH (ref 70–99)
Potassium: 3.7 mEq/L (ref 3.5–5.1)
Sodium: 142 mEq/L (ref 135–145)
Total Bilirubin: 0.4 mg/dL (ref 0.2–1.2)
Total Protein: 7.1 g/dL (ref 6.0–8.3)

## 2022-11-29 LAB — CBC
HCT: 43.9 % (ref 39.0–52.0)
Hemoglobin: 14.9 g/dL (ref 13.0–17.0)
MCHC: 34 g/dL (ref 30.0–36.0)
MCV: 86.6 fl (ref 78.0–100.0)
Platelets: 342 10*3/uL (ref 150.0–400.0)
RBC: 5.07 Mil/uL (ref 4.22–5.81)
RDW: 13.2 % (ref 11.5–15.5)
WBC: 6.9 10*3/uL (ref 4.0–10.5)

## 2022-11-29 LAB — LIPID PANEL
Cholesterol: 185 mg/dL (ref 0–200)
HDL: 36.8 mg/dL — ABNORMAL LOW (ref >39.00)
NonHDL: 148.56
Total CHOL/HDL Ratio: 5
Triglycerides: 266 mg/dL — ABNORMAL HIGH (ref 0.0–149.0)
VLDL: 53.2 mg/dL — ABNORMAL HIGH (ref 0.0–40.0)

## 2022-11-29 LAB — PSA: PSA: 2.11 ng/mL (ref 0.10–4.00)

## 2022-11-29 LAB — HEMOGLOBIN A1C: Hgb A1c MFr Bld: 7.6 % — ABNORMAL HIGH (ref 4.6–6.5)

## 2022-11-29 LAB — LDL CHOLESTEROL, DIRECT: Direct LDL: 125 mg/dL

## 2022-11-29 NOTE — Progress Notes (Signed)
Chief Complaint  Patient presents with   Annual Exam    Back pain     Well Male William Adkins is here for a complete physical.   His last physical was >1 year ago.  Current diet: in general, a "healthy" diet.  Current exercise: none Weight trend: stable Fatigue out of ordinary? No. Seat belt? Yes.   Advanced directive? No  Health maintenance Shingrix- No Colonoscopy- Yes Tetanus- Yes HIV- Yes Hep C- Yes   Past Medical History:  Diagnosis Date   Allergy    Diabetes mellitus without complication ([place])    Hypertension       Past Surgical History:  Procedure Laterality Date   colonoscopy     SHOULDER SURGERY  1998   right shoulder states "it would disclocate a lot; had to stretch tendon  and staple it to the top"    Medications  Current Outpatient Medications on File Prior to Visit  Medication Sig Dispense Refill   amLODipine (NORVASC) 10 MG tablet TAKE 1 TABLET BY MOUTH EVERY DAY 90 tablet 1   chlorthalidone (HYGROTON) 25 MG tablet TAKE 1 TABLET BY MOUTH EVERY DAY 90 tablet 1   Dulaglutide (TRULICITY) 1.5 YW/7.3XT SOPN Inject 1.5 mg into the skin once a week. 4 mL 5   esomeprazole (NEXIUM) 20 MG capsule Take 20 mg by mouth daily at 12 noon.     ibuprofen (ADVIL,MOTRIN) 200 MG tablet Take 400-600 mg by mouth 3 (three) times daily as needed for mild pain.     montelukast (SINGULAIR) 10 MG tablet Take 1 tablet (10 mg total) by mouth at bedtime. 90 tablet 1   Multiple Vitamin (MULTIVITAMIN WITH MINERALS) TABS tablet Take 1 tablet by mouth daily.     Multiple Vitamins-Minerals (ONE-A-DAY 50 PLUS PO) Take 1 tablet by mouth daily.     olmesartan (BENICAR) 40 MG tablet TAKE 1 TABLET BY MOUTH EVERY DAY 90 tablet 1   pravastatin (PRAVACHOL) 80 MG tablet TAKE 1 TABLET BY MOUTH EVERY DAY 90 tablet 1   Probiotic Product (PROBIOTIC ADVANCED PO) Take 1 capsule by mouth daily.      Allergies No Known Allergies  Family History Family History  Problem Relation Age of Onset    Diabetes Mother    Heart disease Father    Hypertension Father    Diabetes Father    Hypertension Brother    Heart disease Brother    Diabetes Brother    Heart attack Brother    Colon cancer Paternal Aunt    Prostate cancer Neg Hx     Review of Systems: Constitutional:  no fevers Eye:  no recent significant change in vision Ear/Nose/Mouth/Throat:  Ears:  no hearing loss Nose/Mouth/Throat:  no complaints of nasal congestion, no sore throat Cardiovascular:  no chest pain Respiratory:  no shortness of breath Gastrointestinal:  no change in bowel habits GU:  Male: negative for dysuria, frequency Musculoskeletal/Extremities:  +low back pain Integumentary (Skin/Breast):  no abnormal skin lesions reported Neurologic:  no headaches Endocrine: No unexpected weight changes Hematologic/Lymphatic:  no abnormal bleeding  Exam BP 132/80 (BP Location: Left Arm, Cuff Size: Normal)   Pulse 84   Temp 98.8 F (37.1 C) (Oral)   Ht '6\' 4"'$  (1.93 m)   Wt 262 lb 4 oz (119 kg)   SpO2 96%   BMI 31.92 kg/m  General:  well developed, well nourished, in no apparent distress Skin:  no significant moles, warts, or growths Head:  no masses, lesions, or tenderness Eyes:  pupils equal and round, sclera anicteric without injection Ears:  canals without lesions, TMs shiny without retraction, no obvious effusion, no erythema Nose:  nares patent, mucosa normal Throat/Pharynx:  lips and gingiva without lesion; tongue and uvula midline; non-inflamed pharynx; no exudates or postnasal drainage Neck: neck supple without adenopathy, thyromegaly, or masses Cardiac: RRR, no bruits, no LE edema Lungs:  clear to auscultation, breath sounds equal bilaterally, no respiratory distress Abdomen: BS+, soft, non-tender, non-distended, no masses or organomegaly noted Rectal: Deferred Musculoskeletal:  symmetrical muscle groups noted without atrophy or deformity Neuro:  gait normal; deep tendon reflexes normal and  symmetric Psych: well oriented with normal range of affect and appropriate judgment/insight  Assessment and Plan  Well adult exam - Plan: CBC, Comprehensive metabolic panel, Lipid panel  Type 2 diabetes mellitus with hyperglycemia, without long-term current use of insulin (HCC) - Plan: Hemoglobin A1c  Screening for prostate cancer - Plan: PSA   Well 56 y.o. male. Counseled on diet and exercise. Counseled on risks and benefits of prostate cancer screening with PSA. The patient agrees to undergo testing. Flu shot today. Shingrix rec'd.  Advanced directive form provided today.  Immunizations, labs, and further orders as above. Follow up in 6 mo or prn. The patient voiced understanding and agreement to the plan.  [place], DO 11/29/22 10:23 AM

## 2022-11-29 NOTE — Patient Instructions (Addendum)
Give [place] 2-3 business days to get the results of your labs back.   Keep the diet clean and stay active.  Heat (pad or rice pillow in microwave) over affected area, 10-15 minutes twice daily.   The Shingrix vaccine (for shingles) is a 2 shot series spaced 2-6 months apart. It can make people feel low energy, achy and almost like they have the flu for 48 hours after injection. 1/5 people can have nausea and/or vomiting. Please plan accordingly when deciding on when to get this shot. Call our office for a nurse visit appointment to get this. The second shot of the series is less severe regarding the side effects, but it still lasts 48 hours.   Please get me a copy of your advanced directive form at your convenience.   Let us know if you need anything.  EXERCISES  RANGE OF MOTION (ROM) AND STRETCHING EXERCISES - Low Back Pain Most people with lower back pain will find that their symptoms get worse with excessive bending forward (flexion) or arching at the lower back (extension). The exercises that will help resolve your symptoms will focus on the opposite motion.  If you have pain, numbness or tingling which travels down into your buttocks, leg or foot, the goal of the therapy is for these symptoms to move closer to your back and eventually resolve. Sometimes, these leg symptoms will get better, but your lower back pain may worsen. This is often an indication of progress in your rehabilitation. Be very alert to any changes in your symptoms and the activities in which you participated in the 24 hours prior to the change. Sharing this information with your caregiver will allow him or her to most efficiently treat your condition. These exercises may help you when beginning to rehabilitate your injury. Your symptoms may resolve with or without further involvement from your physician, physical therapist or athletic trainer. While completing these exercises, remember:  Restoring tissue flexibility helps normal  motion to return to the joints. This allows healthier, less painful movement and activity. An effective stretch should be held for at least 30 seconds. A stretch should never be painful. You should only feel a gentle lengthening or release in the stretched tissue. FLEXION RANGE OF MOTION AND STRETCHING EXERCISES:  STRETCH - Flexion, Single Knee to Chest  Lie on a firm bed or floor with both legs extended in front of you. Keeping one leg in contact with the floor, bring your opposite knee to your chest. Hold your leg in place by either grabbing behind your thigh or at your knee. Pull until you feel a gentle stretch in your low back. Hold 30 seconds. Slowly release your grasp and repeat the exercise with the opposite side. Repeat 2 times. Complete this exercise 3 times per week.   STRETCH - Flexion, Double Knee to Chest Lie on a firm bed or floor with both legs extended in front of you. Keeping one leg in contact with the floor, bring your opposite knee to your chest. Tense your stomach muscles to support your back and then lift your other knee to your chest. Hold your legs in place by either grabbing behind your thighs or at your knees. Pull both knees toward your chest until you feel a gentle stretch in your low back. Hold 30 seconds. Tense your stomach muscles and slowly return one leg at a time to the floor. Repeat 2 times. Complete this exercise 3 times per week.   STRETCH - Low Trunk  Rotation Lie on a firm bed or floor. Keeping your legs in front of you, bend your knees so they are both pointed toward the ceiling and your feet are flat on the floor. Extend your arms out to the side. This will stabilize your upper body by keeping your shoulders in contact with the floor. Gently and slowly drop both knees together to one side until you feel a gentle stretch in your low back. Hold for 30 seconds. Tense your stomach muscles to support your lower back as you bring your knees back to the  starting position. Repeat the exercise to the other side. Repeat 2 times. Complete this exercise at least 3 times per week.   EXTENSION RANGE OF MOTION AND FLEXIBILITY EXERCISES:  STRETCH - Extension, Prone on Elbows  Lie on your stomach on the floor, a bed will be too soft. Place your palms about shoulder width apart and at the height of your head. Place your elbows under your shoulders. If this is too painful, stack pillows under your chest. Allow your body to relax so that your hips drop lower and make contact more completely with the floor. Hold this position for 30 seconds. Slowly return to lying flat on the floor. Repeat 2 times. Complete this exercise 3 times per week.   RANGE OF MOTION - Extension, Prone Press Ups Lie on your stomach on the floor, a bed will be too soft. Place your palms about shoulder width apart and at the height of your head. Keeping your back as relaxed as possible, slowly straighten your elbows while keeping your hips on the floor. You may adjust the placement of your hands to maximize your comfort. As you gain motion, your hands will come more underneath your shoulders. Hold this position 30 seconds. Slowly return to lying flat on the floor. Repeat 2 times. Complete this exercise 3 times per week.   RANGE OF MOTION- Quadruped, Neutral Spine  Assume a hands and knees position on a firm surface. Keep your hands under your shoulders and your knees under your hips. You may place padding under your knees for comfort. Drop your head and point your tailbone toward the ground below you. This will round out your lower back like an angry cat. Hold this position for 30 seconds. Slowly lift your head and release your tail bone so that your back sags into a large arch, like an old horse. Hold this position for 30 seconds. Repeat this until you feel limber in your low back. Now, find your "sweet spot." This will be the most comfortable position somewhere between the two  previous positions. This is your neutral spine. Once you have found this position, tense your stomach muscles to support your low back. Hold this position for 30 seconds. Repeat 2 times. Complete this exercise 3 times per week.   STRENGTHENING EXERCISES - Low Back Sprain These exercises may help you when beginning to rehabilitate your injury. These exercises should be done near your "sweet spot." This is the neutral, low-back arch, somewhere between fully rounded and fully arched, that is your least painful position. When performed in this safe range of motion, these exercises can be used for people who have either a flexion or extension based injury. These exercises may resolve your symptoms with or without further involvement from your physician, physical therapist or athletic trainer. While completing these exercises, remember:  Muscles can gain both the endurance and the strength needed for everyday activities through controlled exercises. Complete these  exercises as instructed by your physician, physical therapist or athletic trainer. Increase the resistance and repetitions only as guided. You may experience muscle soreness or fatigue, but the pain or discomfort you are trying to eliminate should never worsen during these exercises. If this pain does worsen, stop and make certain you are following the directions exactly. If the pain is still present after adjustments, discontinue the exercise until you can discuss the trouble with your caregiver.  STRENGTHENING - Deep Abdominals, Pelvic Tilt  Lie on a firm bed or floor. Keeping your legs in front of you, bend your knees so they are both pointed toward the ceiling and your feet are flat on the floor. Tense your lower abdominal muscles to press your low back into the floor. This motion will rotate your pelvis so that your tail bone is scooping upwards rather than pointing at your feet or into the floor. With a gentle tension and even breathing, hold  this position for 3 seconds. Repeat 2 times. Complete this exercise 3 times per week.   STRENGTHENING - Abdominals, Crunches  Lie on a firm bed or floor. Keeping your legs in front of you, bend your knees so they are both pointed toward the ceiling and your feet are flat on the floor. Cross your arms over your chest. Slightly tip your chin down without bending your neck. Tense your abdominals and slowly lift your trunk high enough to just clear your shoulder blades. Lifting higher can put excessive stress on the lower back and does not further strengthen your abdominal muscles. Control your return to the starting position. Repeat 2 times. Complete this exercise 3 times per week.   STRENGTHENING - Quadruped, Opposite UE/LE Lift  Assume a hands and knees position on a firm surface. Keep your hands under your shoulders and your knees under your hips. You may place padding under your knees for comfort. Find your neutral spine and gently tense your abdominal muscles so that you can maintain this position. Your shoulders and hips should form a rectangle that is parallel with the floor and is not twisted. Keeping your trunk steady, lift your right hand no higher than your shoulder and then your left leg no higher than your hip. Make sure you are not holding your breath. Hold this position for 30 seconds. Continuing to keep your abdominal muscles tense and your back steady, slowly return to your starting position. Repeat with the opposite arm and leg. Repeat 2 times. Complete this exercise 3 times per week.   STRENGTHENING - Abdominals and Quadriceps, Straight Leg Raise  Lie on a firm bed or floor with both legs extended in front of you. Keeping one leg in contact with the floor, bend the other knee so that your foot can rest flat on the floor. Find your neutral spine, and tense your abdominal muscles to maintain your spinal position throughout the exercise. Slowly lift your straight leg off the floor  about 6 inches for a count of 3, making sure to not hold your breath. Still keeping your neutral spine, slowly lower your leg all the way to the floor. Repeat this exercise with each leg 2 times. Complete this exercise 3 times per week.  POSTURE AND BODY MECHANICS CONSIDERATIONS - Low Back Sprain Keeping correct posture when sitting, standing or completing your activities will reduce the stress put on different body tissues, allowing injured tissues a chance to heal and limiting painful experiences. The following are general guidelines for improved posture.  While  reading these guidelines, remember: The exercises prescribed by your provider will help you have the flexibility and strength to maintain correct postures. The correct posture provides the best environment for your joints to work. All of your joints have less wear and tear when properly supported by a spine with good posture. This means you will experience a healthier, less painful body. Correct posture must be practiced with all of your activities, especially prolonged sitting and standing. Correct posture is as important when doing repetitive low-stress activities (typing) as it is when doing a single heavy-load activity (lifting).  RESTING POSITIONS Consider which positions are most painful for you when choosing a resting position. If you have pain with flexion-based activities (sitting, bending, stooping, squatting), choose a position that allows you to rest in a less flexed posture. You would want to avoid curling into a fetal position on your side. If your pain worsens with extension-based activities (prolonged standing, working overhead), avoid resting in an extended position such as sleeping on your stomach. Most people will find more comfort when they rest with their spine in a more neutral position, neither too rounded nor too arched. Lying on a non-sagging bed on your side with a pillow between your knees, or on your back with a  pillow under your knees will often provide some relief. Keep in mind, being in any one position for a prolonged period of time, no matter how correct your posture, can still lead to stiffness.  PROPER SITTING POSTURE In order to minimize stress and discomfort on your spine, you must sit with correct posture. Sitting with good posture should be effortless for a healthy body. Returning to good posture is a gradual process. Many people can work toward this most comfortably by using various supports until they have the flexibility and strength to maintain this posture on their own. When sitting with proper posture, your ears will fall over your shoulders and your shoulders will fall over your hips. You should use the back of the chair to support your upper back. Your lower back will be in a neutral position, just slightly arched. You may place a small pillow or folded towel at the base of your lower back for  support.  When working at a desk, create an environment that supports good, upright posture. Without extra support, muscles tire, which leads to excessive strain on joints and other tissues. Keep these recommendations in mind:  CHAIR: A chair should be able to slide under your desk when your back makes contact with the back of the chair. This allows you to work closely. The chair's height should allow your eyes to be level with the upper part of your monitor and your hands to be slightly lower than your elbows.  BODY POSITION Your feet should make contact with the floor. If this is not possible, use a foot rest. Keep your ears over your shoulders. This will reduce stress on your neck and low back.  INCORRECT SITTING POSTURES  If you are feeling tired and unable to assume a healthy sitting posture, do not slouch or slump. This puts excessive strain on your back tissues, causing more damage and pain. Healthier options include: Using more support, like a lumbar pillow. Switching tasks to something  that requires you to be upright or walking. Talking a brief walk. Lying down to rest in a neutral-spine position.  PROLONGED STANDING WHILE SLIGHTLY LEANING FORWARD  When completing a task that requires you to lean forward while standing in one  place for a long time, place either foot up on a stationary 2-4 inch high object to help maintain the best posture. When both feet are on the ground, the lower back tends to lose its slight inward curve. If this curve flattens (or becomes too large), then the back and your other joints will experience too much stress, tire more quickly, and can cause pain.  CORRECT STANDING POSTURES Proper standing posture should be assumed with all daily activities, even if they only take a few moments, like when brushing your teeth. As in sitting, your ears should fall over your shoulders and your shoulders should fall over your hips. You should keep a slight tension in your abdominal muscles to brace your spine. Your tailbone should point down to the ground, not behind your body, resulting in an over-extended swayback posture.   INCORRECT STANDING POSTURES  Common incorrect standing postures include a forward head, locked knees and/or an excessive swayback. WALKING Walk with an upright posture. Your ears, shoulders and hips should all line-up.  PROLONGED ACTIVITY IN A FLEXED POSITION When completing a task that requires you to bend forward at your waist or lean over a low surface, try to find a way to stabilize 3 out of 4 of your limbs. You can place a hand or elbow on your thigh or rest a knee on the surface you are reaching across. This will provide you more stability, so that your muscles do not tire as quickly. By keeping your knees relaxed, or slightly bent, you will also reduce stress across your lower back. CORRECT LIFTING TECHNIQUES  DO : Assume a wide stance. This will provide you more stability and the opportunity to get as close as possible to the object  which you are lifting. Tense your abdominals to brace your spine. Bend at the knees and hips. Keeping your back locked in a neutral-spine position, lift using your leg muscles. Lift with your legs, keeping your back straight. Test the weight of unknown objects before attempting to lift them. Try to keep your elbows locked down at your sides in order get the best strength from your shoulders when carrying an object.   Always ask for help when lifting heavy or awkward objects. INCORRECT LIFTING TECHNIQUES DO NOT:  Lock your knees when lifting, even if it is a small object. Bend and twist. Pivot at your feet or move your feet when needing to change directions. Assume that you can safely pick up even a paperclip without proper posture.

## 2022-11-29 NOTE — Addendum Note (Signed)
Addended by: Sharon Seller B on: 11/29/2022 10:27 AM   Modules accepted: Orders

## 2022-11-30 ENCOUNTER — Encounter: Payer: Self-pay | Admitting: Family Medicine

## 2022-11-30 ENCOUNTER — Other Ambulatory Visit: Payer: Self-pay | Admitting: Family Medicine

## 2022-11-30 MED ORDER — TRULICITY 3 MG/0.5ML ~~LOC~~ SOAJ: 3.0000 mg | SUBCUTANEOUS | 5 refills | Status: DC

## 2022-12-03 ENCOUNTER — Encounter: Payer: Self-pay | Admitting: Family Medicine

## 2023-01-06 ENCOUNTER — Other Ambulatory Visit: Payer: Self-pay | Admitting: Family Medicine

## 2023-01-10 ENCOUNTER — Encounter: Payer: 59 | Admitting: Family Medicine

## 2023-02-11 ENCOUNTER — Telehealth: Payer: Self-pay | Admitting: Family Medicine

## 2023-02-11 ENCOUNTER — Ambulatory Visit (INDEPENDENT_AMBULATORY_CARE_PROVIDER_SITE_OTHER): Payer: 59 | Admitting: Family Medicine

## 2023-02-11 ENCOUNTER — Ambulatory Visit: Payer: 59 | Admitting: Family Medicine

## 2023-02-11 ENCOUNTER — Encounter: Payer: Self-pay | Admitting: Family Medicine

## 2023-02-11 VITALS — BP 112/76 | HR 92 | Temp 98.0°F | Ht 76.0 in | Wt 264.0 lb

## 2023-02-11 DIAGNOSIS — E1165 Type 2 diabetes mellitus with hyperglycemia: Secondary | ICD-10-CM

## 2023-02-11 MED ORDER — EMPAGLIFLOZIN 25 MG PO TABS: 25.0000 mg | ORAL_TABLET | Freq: Every day | ORAL | 2 refills | Status: DC

## 2023-02-11 MED ORDER — SITAGLIPTIN PHOSPHATE 100 MG PO TABS: 100.0000 mg | ORAL_TABLET | Freq: Every day | ORAL | 2 refills | Status: DC

## 2023-02-11 NOTE — Progress Notes (Signed)
Chief Complaint  Patient presents with   medication change    Subjective: Patient is a 57 y.o. male here for f/u.  Diet is OK. Was taking Trulicity 3 mg/week. Starting to get diarrhea over the past 2 mo. No bleeding. Exercise is limited. No CP or SOB. Does not routinely check his sugars. Usually running low 100's randomly.   Past Medical History:  Diagnosis Date   Allergy    Diabetes mellitus without complication (HCC)    Hypertension     Objective: BP 112/76 (BP Location: Left Arm, Patient Position: Sitting, Cuff Size: Large)   Pulse 92   Temp 98 F (36.7 C) (Oral)   Ht '6\' 4"'$  (1.93 m)   Wt 264 lb (119.7 kg)   SpO2 96%   BMI 32.14 kg/m  General: Awake, appears stated age Heart: RRR, no LE edema Lungs: CTAB, no rales, wheezes or rhonchi. No accessory muscle use Psych: Age appropriate judgment and insight, normal affect and mood  Assessment and Plan: Type 2 diabetes mellitus with hyperglycemia, without long-term current use of insulin (HCC)  Adverse effect of medication.  Stop Trulicity.  Start Jardiance 25 mg daily.  He will let me know if there are any cost issues with this.  Will try Januvia if there is.  Follow-up in 6 weeks.  Cancel all future appointments.  Counseled on diet and exercise.  Continue to monitor sugars at home. The patient voiced understanding and agreement to the plan.  [place], DO 02/11/23  3:40 PM

## 2023-02-11 NOTE — Telephone Encounter (Signed)
Pharmacy informed.

## 2023-02-11 NOTE — Telephone Encounter (Signed)
Replace the generic [place] states is not available for them Please sent in alternative

## 2023-02-11 NOTE — Telephone Encounter (Signed)
Alt sent.

## 2023-02-11 NOTE — Patient Instructions (Signed)
Keep the diet clean and stay active.  Let me know if there are cost issues.   Let us know if you need anything.

## 2023-02-14 ENCOUNTER — Telehealth: Payer: Self-pay

## 2023-02-14 NOTE — Telephone Encounter (Signed)
PA initiated via Covermymeds; KEY: [id number]. Awaiting determination.

## 2023-02-14 NOTE — Telephone Encounter (Signed)
PA denied.   The requested medication and/or diagnosis are not a covered benefit and are excluded from coverage in accordance with the terms and conditions of your plan benefit. Therefore, this request has been administratively denied.

## 2023-02-14 NOTE — Telephone Encounter (Signed)
I recommend Pt discuss with his insurance- it may be a short term plan and they may not cover pre-existing conditions such as diabetes.

## 2023-02-14 NOTE — Telephone Encounter (Signed)
Called the patient informed of PCP instructions. He has already refilled the [place]  He had already spoken to his insurance company, he has to meet a deductible then once done will cover. He has picked up Januvia for he and his wife already

## 2023-02-28 ENCOUNTER — Ambulatory Visit: Payer: 59 | Admitting: Family Medicine

## 2023-03-19 ENCOUNTER — Other Ambulatory Visit: Payer: Self-pay | Admitting: Family Medicine

## 2023-03-25 ENCOUNTER — Ambulatory Visit: Payer: 59 | Admitting: Family Medicine

## 2023-04-05 IMAGING — CT CT ABD-PELV W/ CM
2 of 5 series · 16 of 46 positions shown, 18 images · IV contrast (OMNIPAQUE 350)
Comparison: 04/19/2017

CLINICAL DATA: Bowel obstruction suspected History of partial
colectomy, severe abdominal pain with nausea, vomiting, and
decreased flatus.

EXAM:
CT ABDOMEN AND PELVIS WITH CONTRAST
TECHNIQUE: Multidetector CT imaging of the abdomen and pelvis was performed
using the standard protocol following bolus administration of
intravenous contrast.
CONTRAST:  80mL OMNIPAQUE IOHEXOL 350 MG/ML SOLN

[Series 2: axial st · axial · 0.83mm/px · z∈[+1079,+1519]mm · 13 of 102 slices shown, 15 images]
[im 7/102  soft-tissue]
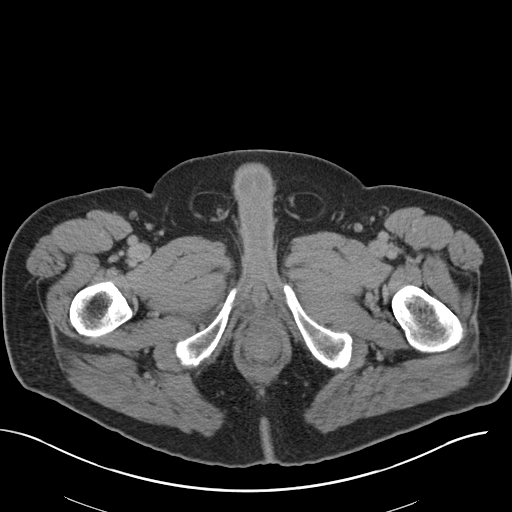
[im 7/102  bone]
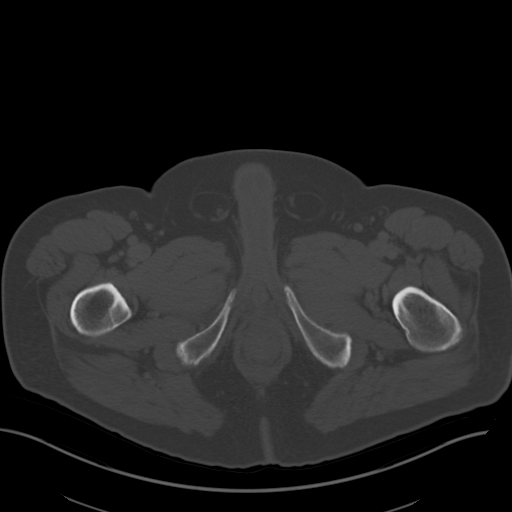
[im 13/102  soft-tissue]
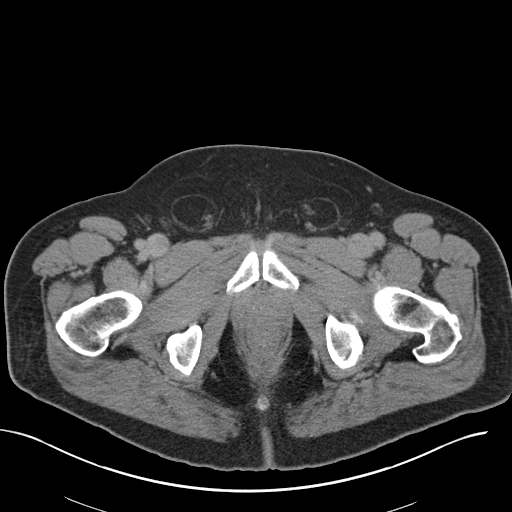
[im 19/102  soft-tissue]
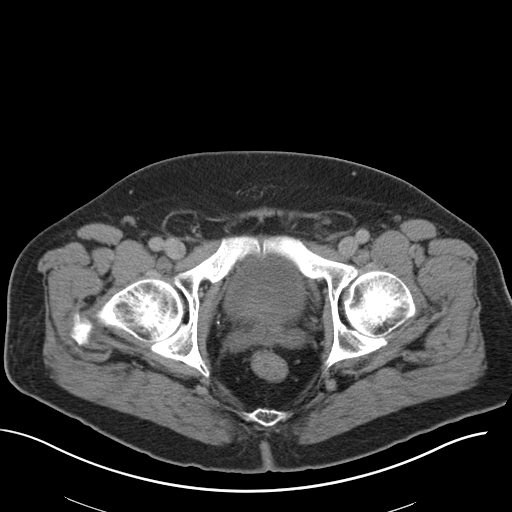
[im 32/102  soft-tissue]
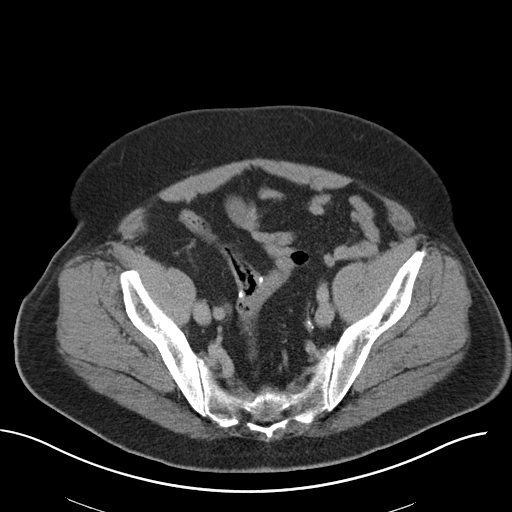
[im 38/102  soft-tissue]
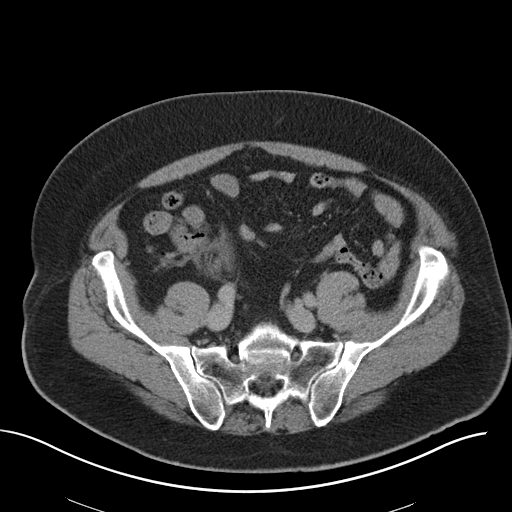
[im 45/102  soft-tissue]
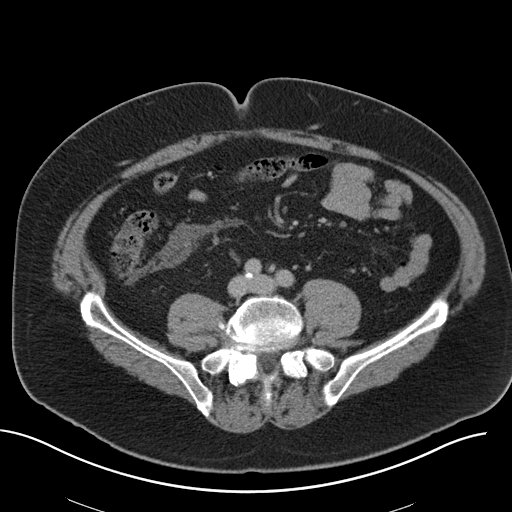
[im 51/102  soft-tissue]
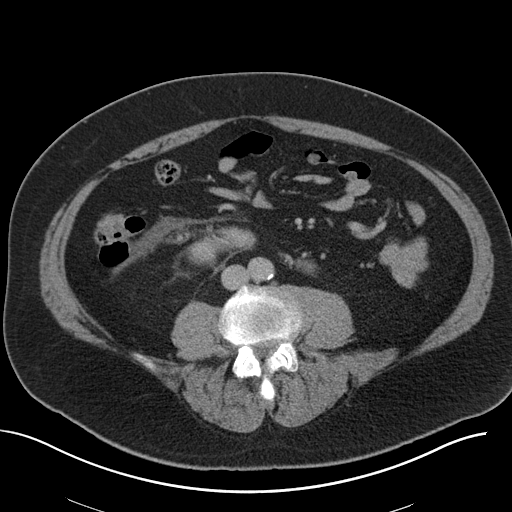
[im 57/102  soft-tissue]
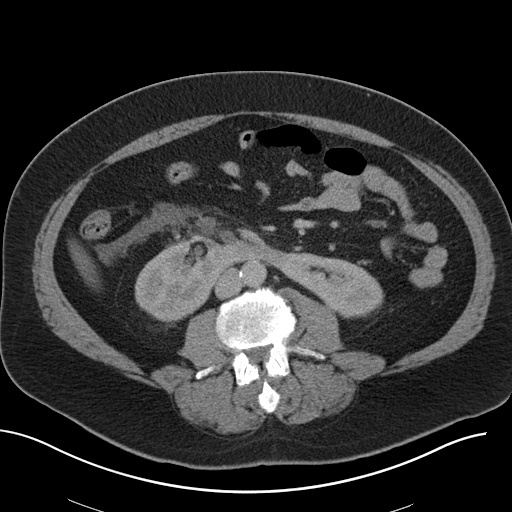
[im 64/102  soft-tissue]
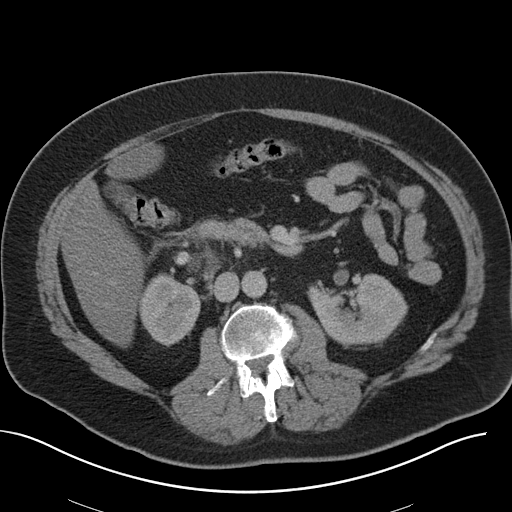
[im 64/102  bone]
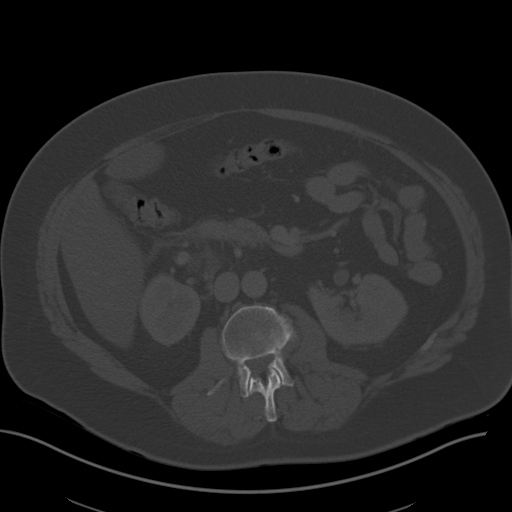
[im 70/102  soft-tissue]
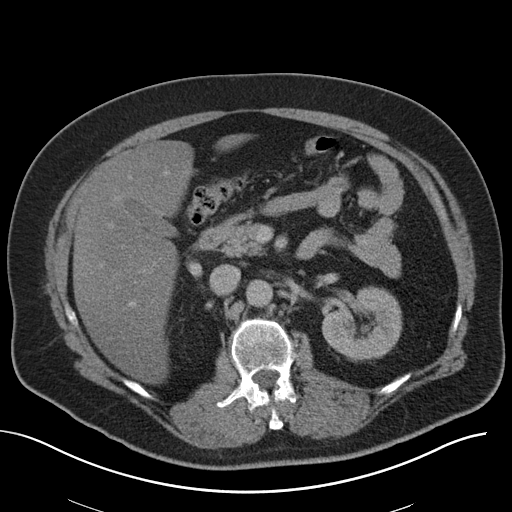
[im 83/102  soft-tissue]
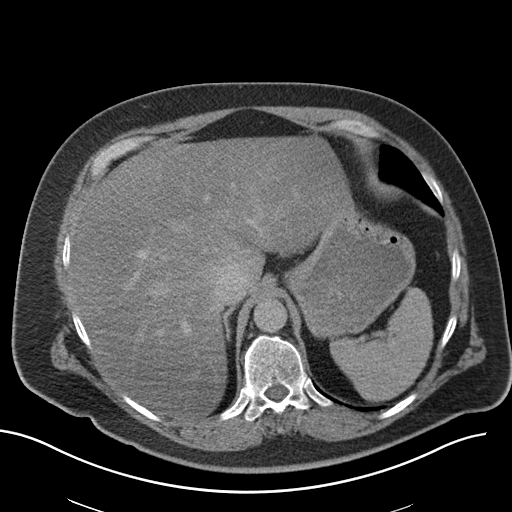
[im 89/102  soft-tissue]
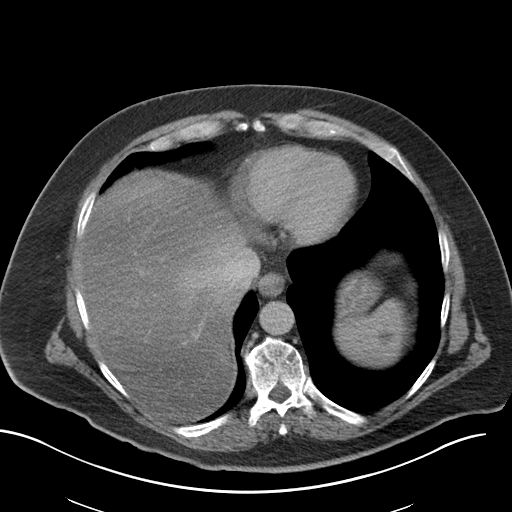
[im 95/102  soft-tissue]
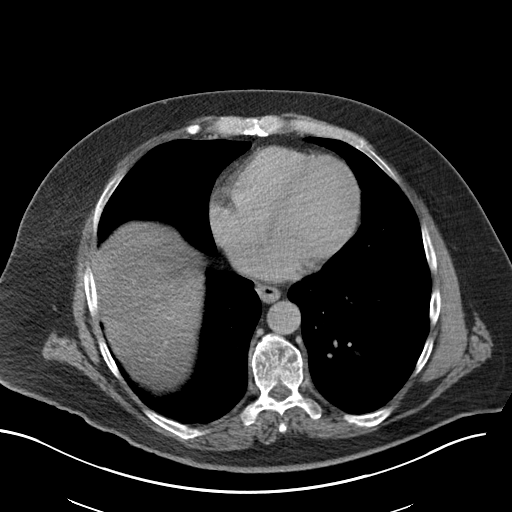

[Series 4: coronal st · coronal · 0.83mm/px · 3 of 160 slices shown]
[im 54/160  soft-tissue]
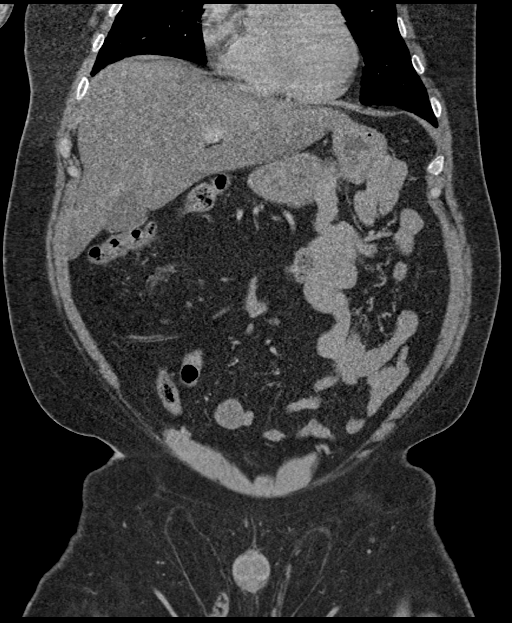
[im 71/160  soft-tissue]
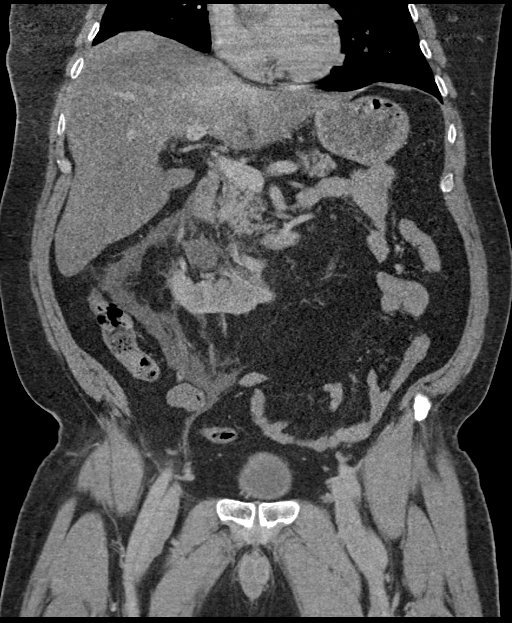
[im 89/160  soft-tissue]
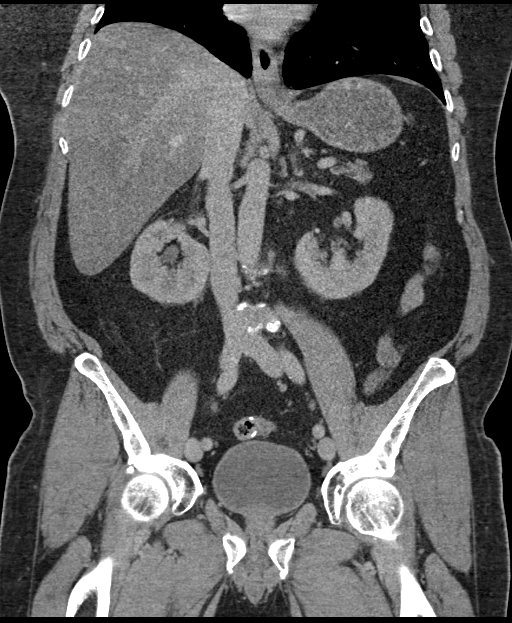

[16 of 46 positions shown; findings below may reference images not displayed]

FINDINGS: Lower chest: No acute abnormality.

Hepatobiliary: Diffuse low-density throughout the liver compatible
with fatty infiltration. No focal abnormality. Gallbladder
unremarkable.

Pancreas: No focal abnormality or ductal dilatation.

Spleen: No focal abnormality.  Normal size.

Adrenals/Urinary Tract: Horseshoe kidney. Mild right hydronephrosis
and perinephric stranding. There appears to be a tiny 1 mm distal
right ureteral stone. No hydronephrosis on the left. Adrenal glands
and urinary bladder unremarkable.

Stomach/Bowel: Prior partial colectomy. No evidence of bowel
obstruction.

Vascular/Lymphatic: Aortic atherosclerosis. No evidence of aneurysm
or adenopathy.

Reproductive: No visible focal abnormality.

Other: No free fluid or free air. Small bilateral inguinal hernias
containing fat.

Musculoskeletal: No acute bony abnormality.
IMPRESSION: Hepatic steatosis.

Horseshoe kidney.

Mild right hydronephrosis and perinephric stranding with 1 mm distal
right ureteral stone.

Aortic atherosclerosis.

## 2023-04-22 ENCOUNTER — Other Ambulatory Visit: Payer: Self-pay | Admitting: Family Medicine

## 2023-05-06 ENCOUNTER — Encounter: Payer: Self-pay | Admitting: Family Medicine

## 2023-05-06 ENCOUNTER — Other Ambulatory Visit: Payer: Self-pay | Admitting: Family Medicine

## 2023-05-06 ENCOUNTER — Ambulatory Visit (INDEPENDENT_AMBULATORY_CARE_PROVIDER_SITE_OTHER): Payer: 59 | Admitting: Family Medicine

## 2023-05-06 VITALS — BP 120/80 | HR 100 | Temp 97.7°F | Ht 76.0 in | Wt 262.4 lb

## 2023-05-06 DIAGNOSIS — Z7984 Long term (current) use of oral hypoglycemic drugs: Secondary | ICD-10-CM | POA: Diagnosis not present

## 2023-05-06 DIAGNOSIS — I10 Essential (primary) hypertension: Secondary | ICD-10-CM | POA: Diagnosis not present

## 2023-05-06 DIAGNOSIS — E1165 Type 2 diabetes mellitus with hyperglycemia: Secondary | ICD-10-CM | POA: Diagnosis not present

## 2023-05-06 LAB — COMPREHENSIVE METABOLIC PANEL
ALT: 39 U/L (ref 0–53)
AST: 27 U/L (ref 0–37)
Albumin: 4.9 g/dL (ref 3.5–5.2)
Alkaline Phosphatase: 60 U/L (ref 39–117)
BUN: 23 mg/dL (ref 6–23)
CO2: 28 mEq/L (ref 19–32)
Calcium: 10.1 mg/dL (ref 8.4–10.5)
Chloride: 98 mEq/L (ref 96–112)
Creatinine, Ser: 1.03 mg/dL (ref 0.40–1.50)
GFR: 80.57 mL/min (ref >60.00)
Glucose, Bld: 155 mg/dL — ABNORMAL HIGH (ref 70–99)
Potassium: 4.4 mEq/L (ref 3.5–5.1)
Sodium: 137 mEq/L (ref 135–145)
Total Bilirubin: 0.4 mg/dL (ref 0.2–1.2)
Total Protein: 7.4 g/dL (ref 6.0–8.3)

## 2023-05-06 LAB — LIPID PANEL
Cholesterol: 185 mg/dL (ref 0–200)
HDL: 34.2 mg/dL — ABNORMAL LOW (ref >39.00)
NonHDL: 150.43
Total CHOL/HDL Ratio: 5
Triglycerides: 348 mg/dL — ABNORMAL HIGH (ref 0.0–149.0)
VLDL: 69.6 mg/dL — ABNORMAL HIGH (ref 0.0–40.0)

## 2023-05-06 LAB — HEMOGLOBIN A1C: Hgb A1c MFr Bld: 8 % — ABNORMAL HIGH (ref 4.6–6.5)

## 2023-05-06 LAB — LDL CHOLESTEROL, DIRECT: Direct LDL: 121 mg/dL

## 2023-05-06 MED ORDER — PIOGLITAZONE HCL 30 MG PO TABS: 30.0000 mg | ORAL_TABLET | Freq: Every day | ORAL | 3 refills | Status: DC

## 2023-05-06 MED ORDER — CHLORTHALIDONE 25 MG PO TABS
25.0000 mg | ORAL_TABLET | Freq: Every day | ORAL | 2 refills | Status: DC
Start: 2023-05-06 — End: 2024-05-23

## 2023-05-06 MED ORDER — OLMESARTAN MEDOXOMIL 40 MG PO TABS: 40.0000 mg | ORAL_TABLET | Freq: Every day | ORAL | 2 refills | Status: DC

## 2023-05-06 MED ORDER — AMLODIPINE BESYLATE 10 MG PO TABS: 10.0000 mg | ORAL_TABLET | Freq: Every day | ORAL | 2 refills | Status: DC

## 2023-05-06 MED ORDER — EMPAGLIFLOZIN 25 MG PO TABS: 25.0000 mg | ORAL_TABLET | Freq: Every day | ORAL | 2 refills | Status: DC

## 2023-05-06 NOTE — Patient Instructions (Signed)
Give us 2-3 business days to get the results of your labs back.   Keep the diet clean and stay active.  Let us know if you need anything. 

## 2023-05-06 NOTE — Progress Notes (Signed)
Subjective:   Chief Complaint  Patient presents with   Follow-up    6 week Diabetes check    William Adkins is a 57 y.o. male here for follow-up of diabetes.   William Adkins does not routinely monitor his sugars.  Patient does not require insulin.   Medications include: Jardiance 25 mg/d.  Diet is OK.  Exercise: some walking  Hypertension Patient presents for hypertension follow up. He does not routinely monitor home blood pressures. He is compliant with medications- Norvasc 10 mg/d, chlorthalidone 25 mg/d, olmesartan 40 mg/d. Patient has these side effects of medication: none Diet/exercise as above. No CP or SOB.   Past Medical History:  Diagnosis Date   Allergy    Diabetes mellitus without complication (HCC)    Hypertension      Related testing: Retinal exam: Due, scheduled next week Pneumovax: done  Objective:  BP 120/80 (BP Location: Left Arm, Patient Position: Sitting, Cuff Size: Large)   Pulse 100   Temp 97.7 F (36.5 C) (Oral)   Ht 6\' 4"  (1.93 m)   Wt 262 lb 6 oz (119 kg)   SpO2 97%   BMI 31.94 kg/m  General:  Well developed, well nourished, in no apparent distress Skin:  macerated tissue between 2/3 and 3/4 digits b/l; otherwise, warm, no pallor or diaphoresis Lungs:  CTAB, no access msc use Cardio:  RRR, no bruits, no LE edema Musculoskeletal:  Symmetrical muscle groups noted without atrophy or deformity Neuro:  Sensation intact to pinprick on feet w exception of medial R foot and 3rd distal MT on L foot Psych: Age appropriate judgment and insight  Assessment:   Type 2 diabetes mellitus with hyperglycemia, without long-term current use of insulin (HCC) - Plan: empagliflozin (JARDIANCE) 25 MG TABS tablet  Essential hypertension - Plan: chlorthalidone (HYGROTON) 25 MG tablet, amLODipine (NORVASC) 10 MG tablet, olmesartan (BENICAR) 40 MG tablet   Plan:   Chronic, unsure if controlled. Cont Jardiance 25 mg/d. Ck above. Counseled on diet and exercise.   Chronic, stable. Cont Norvasc 10 mg/d, chlorthalidone 25 mg/d, Benicar 40 mg/d. F/u in 3-6 mo. The patient voiced understanding and agreement to the plan.  Jilda Roche [place], DO 05/06/23 10:33 AM

## 2023-05-30 ENCOUNTER — Ambulatory Visit: Payer: 59 | Admitting: Family Medicine

## 2023-07-20 ENCOUNTER — Encounter (INDEPENDENT_AMBULATORY_CARE_PROVIDER_SITE_OTHER): Payer: Self-pay

## 2023-07-26 ENCOUNTER — Other Ambulatory Visit: Payer: Self-pay | Admitting: Family Medicine

## 2023-08-15 ENCOUNTER — Ambulatory Visit: Payer: 59 | Admitting: Family Medicine

## 2023-09-02 ENCOUNTER — Other Ambulatory Visit: Payer: Self-pay | Admitting: Family Medicine

## 2023-09-05 ENCOUNTER — Encounter: Payer: Self-pay | Admitting: Family Medicine

## 2023-09-05 ENCOUNTER — Ambulatory Visit (INDEPENDENT_AMBULATORY_CARE_PROVIDER_SITE_OTHER): Payer: 59 | Admitting: Family Medicine

## 2023-09-05 VITALS — BP 108/68 | HR 83 | Temp 99.0°F | Ht 76.0 in | Wt 262.1 lb

## 2023-09-05 DIAGNOSIS — Z794 Long term (current) use of insulin: Secondary | ICD-10-CM | POA: Diagnosis not present

## 2023-09-05 DIAGNOSIS — E1165 Type 2 diabetes mellitus with hyperglycemia: Secondary | ICD-10-CM

## 2023-09-05 DIAGNOSIS — Z7984 Long term (current) use of oral hypoglycemic drugs: Secondary | ICD-10-CM

## 2023-09-05 DIAGNOSIS — M7712 Lateral epicondylitis, left elbow: Secondary | ICD-10-CM | POA: Diagnosis not present

## 2023-09-05 DIAGNOSIS — E785 Hyperlipidemia, unspecified: Secondary | ICD-10-CM

## 2023-09-05 LAB — COMPREHENSIVE METABOLIC PANEL
ALT: 28 U/L (ref 0–53)
AST: 19 U/L (ref 0–37)
Albumin: 4.6 g/dL (ref 3.5–5.2)
Alkaline Phosphatase: 57 U/L (ref 39–117)
BUN: 26 mg/dL — ABNORMAL HIGH (ref 6–23)
CO2: 30 meq/L (ref 19–32)
Calcium: 10.2 mg/dL (ref 8.4–10.5)
Chloride: 99 meq/L (ref 96–112)
Creatinine, Ser: 1.08 mg/dL (ref 0.40–1.50)
GFR: 75.94 mL/min (ref >60.00)
Glucose, Bld: 112 mg/dL — ABNORMAL HIGH (ref 70–99)
Potassium: 4.7 meq/L (ref 3.5–5.1)
Sodium: 140 mEq/L (ref 135–145)
Total Bilirubin: 0.5 mg/dL (ref 0.2–1.2)
Total Protein: 7.1 g/dL (ref 6.0–8.3)

## 2023-09-05 LAB — MICROALBUMIN / CREATININE URINE RATIO
Creatinine,U: 81.3 mg/dL
Microalb Creat Ratio: 0.9 mg/g (ref 0.0–30.0)
Microalb, Ur: <0.7 mg/dL (ref 0.0–1.9)

## 2023-09-05 LAB — LIPID PANEL
Cholesterol: 226 mg/dL — ABNORMAL HIGH (ref 0–200)
HDL: 45.4 mg/dL (ref >39.00)
LDL Cholesterol: 143 mg/dL — ABNORMAL HIGH (ref 0–99)
NonHDL: 180.1
Total CHOL/HDL Ratio: 5
Triglycerides: 186 mg/dL — ABNORMAL HIGH (ref 0.0–149.0)
VLDL: 37.2 mg/dL (ref 0.0–40.0)

## 2023-09-05 LAB — HEMOGLOBIN A1C: Hgb A1c MFr Bld: 8 % — ABNORMAL HIGH (ref 4.6–6.5)

## 2023-09-05 NOTE — Progress Notes (Signed)
Subjective:   Chief Complaint  Patient presents with   Follow-up    William Adkins is a 57 y.o. male here for follow-up of diabetes.   Flossie does not routinely monitor her sugars.  Patient does not require insulin.   Medications include: Actos 30 mg/d, Jardiance 25 mg/d Diet is OK.  Exercise: none  Hyperlipidemia Patient presents for dyslipidemia follow up. Currently being treated with pravastatin 80 mg/d and compliance with treatment thus far has been good. He denies myalgias. Diet/exercise as above.  No CP or SOB.  The patient is not known to have coexisting coronary artery disease.  L elbow pain Has been going on for a few weeks.  He thinks it happened after washing the top of his car.  No bruising, redness, swelling.  Pain with knee extended his wrist.  He does not play tennis.  Past Medical History:  Diagnosis Date   Allergy    Diabetes mellitus without complication (HCC)    Hypertension      Related testing: Retinal exam: Done Pneumovax: done  Objective:  BP 108/68 (BP Location: Right Arm, Patient Position: Sitting, Cuff Size: Large)   Pulse 83   Temp 99 F (37.2 C) (Oral)   Ht 6\' 4"  (1.93 m)   Wt 262 lb 2 oz (118.9 kg)   SpO2 97%   BMI 31.91 kg/m  General:  Well developed, well nourished, in no apparent distress Skin:  Warm, no pallor or diaphoresis Head:  Normocephalic, atraumatic Eyes:  Pupils equal and round, sclera anicteric without injection  Lungs:  CTAB, no access msc use Cardio:  RRR, no bruits, no LE edema Musculoskeletal: TTP over the left lateral epicondyle.  Pain with resisted wrist extension. Neuro:  Sensation decreased but intact to pinprick on feet Psych: Age appropriate judgment and insight  Assessment:   Type 2 diabetes mellitus with hyperglycemia, without long-term current use of insulin (HCC) - Plan: Lipid panel, Hemoglobin A1c, Microalbumin / creatinine urine ratio, Comprehensive metabolic panel  Hyperlipidemia,  unspecified hyperlipidemia type  Lateral epicondylitis of left elbow   Plan:   Chronic, uncontrolled. Cont Actos 30 mg/d, Jardiance 25 mg/d. Consider restarting glyburide (hypoglycemia) or offering weekly shot. Counseled on diet and exercise. Chronic, uncontrolled. Cont pravastatin 80 mg/d.  Stretches/exercises, ice, heat, Tylenol, forearm strap.  F/u in 3-6 mo. The patient voiced understanding and agreement to the plan.  Jilda Roche [place], DO 09/05/23 11:17 AM

## 2023-09-05 NOTE — Patient Instructions (Addendum)
Give [place] 2-3 business days to get the results of your labs back.   Look at both of your feet at least once per day.   Keep the diet clean and stay active.  Ice/cold pack over area for 10-15 min twice daily.  OK to take Tylenol 1000 mg (2 extra strength tabs) or 975 mg (3 regular strength tabs) every 6 hours as needed.  Heat (pad or rice pillow in microwave) over affected area, 10-15 minutes twice daily.   Consider a forearm strap (Band-IT) to help with your elbow. This can give your elbow a break and allow it to heal faster.  Elbow and Forearm Exercises It is normal to feel mild stretching, pulling, tightness, or discomfort as you do these exercises, but you should stop right away if you feel sudden pain or your pain gets worse.  RANGE OF MOTION EXERCISES These exercises warm up your muscles and joints and improve the movement and flexibility of your injured elbow and forearm. These exercises also help to relieve pain, numbness, and tingling. These exercises are done using the muscles in your injured elbow and forearm. Exercise A: Elbow Flexion, Active Hold your left / right arm at your side, and bend your elbow as far as you can using your left / right arm muscles. Hold this position for 30 seconds. Slowly return to the starting position. Repeat 2 times. Complete this exercise 3 times per week. Exercise B: Elbow Extension, Active Hold your left / right arm at your side, and straighten your elbow as much as you can using your left / right arm muscles. Hold this position for 30 seconds. Slowly return to the starting position. Repeat 2 times. Complete this exercise 3 times per week. Exercise C: Forearm Rotation, Supination, Active Stand or sit with your elbows at your sides. Bend your left / right elbow to an "L" shape (90 degrees). Turn your palm upward until you feel a gentle stretch on the inside of your forearm. Hold this position for 30 seconds. Slowly release and return to the  starting position. Repeat 2 times. Complete this exercise 3 times per week. Exercise D: Forearm Rotation, Pronation, Active Stand or sit with your elbows at your side. Bend your left / right elbow to an "L" shape (90 degrees). Turn your left / right palm downward until you feel a gentle stretch on the top of your forearm. Hold this position for 30 seconds. Slowly release and return to the starting position. Repeat 2 times. Complete this exercise 3 times per week. STRETCHING EXERCISES These exercises warm up your muscles and joints and improve the movement and flexibility of your injured elbow and forearm. These exercises also help to relieve pain, numbness, and tingling. These exercises are done using your healthy elbow and forearm to help stretch the muscles in your injured elbow and forearm. Exercise E: Elbow Flexion, Active-Assisted  Hold your left / right arm at your side, and bend your elbow as much as you can using your left / right arm muscles. Use your other hand to bend your left / right elbow farther. To do this, gently push up on your forearm until you feel a gentle stretch on the back of your elbow. Hold this position for 30 seconds. Slowly return to the starting position. Repeat 2 times. Complete this exercise 3 times per week. Exercise F: Elbow Extension, Active-Assisted  Hold your left / right arm at your side, and straighten your elbow as much as you can using your  left / right arm muscles. Use your other hand to straighten the left / right elbow farther. To do this, gently push down on your forearm until you feel a gentle stretch on the inside of your elbow. Hold this position for 30 seconds. Slowly return to the starting position. Repeat 2 times. Complete this exercise 3 times per weeky. Exercise G: Forearm Rotation, Supination, Active-Assisted  Sit with your left / right elbow bent in an "L" shape (90 degrees) with your forearm resting on a table. Keeping your upper  body and shoulder still, rotate your forearm so your left / right palm faces upward. Use your other hand to help rotate your forearm further until you feel a gentle to moderate stretch. Hold this position for 30 seconds. Slowly release the stretch and return to the starting position. Repeat 2 times. Complete this exercise 3 times per week. Exercise H: Forearm Rotation, Pronation, Active-Assisted  Sit with your left / right elbow bent in an "L" shape (90 degrees) with your forearm resting on a table. Keeping your upper body and shoulder still, rotate your forearm so your palm faces the tabletop. Use your other hand to help rotate your forearm further until you feel a gentle to moderate stretch. Hold this position for 30 seconds. Slowly release the stretch and return to the starting position. Repeat 2 times. Complete this exercise 3 times per week. Exercise I: Elbow Flexion, Supine, Passive Lie on your back. Extend your left / right arm up in the air, bracing it with your other hand. Let your left / right your hand slowly lower toward your shoulder, while your elbow stays pointed toward the ceiling. You should feel a gentle stretch along the back of your upper arm and elbow. If instructed by your health care provider, you may increase the intensity of your stretch by adding a small wrist weight or hand weight. Hold this position for 3 seconds. Slowly return to the starting position. Repeat 2 times. Complete this exercise 3 times per week. Exercise J: Elbow Extension, Supine, Passive  Lie on your back. Make sure that you are in a comfortable position that lets you relax your arm muscles. Place a folded towel under your left / right upper arm so your elbow and shoulder are at the same height. Straighten your left / right arm so your elbow does not rest on the bed or towel. Let the weight of your hand stretch your elbow. Keep your arm and chest muscles relaxed. You should feel a stretch on the  inside of your elbow. If told by your health care provider, you may increase the intensity of your stretch by adding a small wrist weight or hand weight. Hold this position for 30 seconds. Slowly release the stretch. Repeat 2 times. Complete this exercise 3 times per week. STRENGTHENING EXERCISES These exercises build strength and endurance in your elbow and forearm. Endurance is the ability to use your muscles for a long time, even after they get tired. Exercise K: Elbow Flexion, Isometric  Stand or sit up straight. Bend your left / right elbow in an "L" shape (90 degrees) and turn your palm up so your forearm is at the height of your waist. Place your other hand on top of your forearm. Gently push down as your left / right arm resists. Push as hard as you can with both arms without causing any pain or movement at your left / right elbow. Hold this position for 3 seconds. Slowly release the  tension in both arms. Let your muscles relax completely before repeating. Repeat 2 times. Complete this exercise 3 times per week. Exercise L: Elbow Extensors, Isometric  Stand or sit up straight. Place your left / right arm so your palm faces your abdomen and it is at the height of your waist. Place your other hand on the underside of your forearm. Gently push up as your left / right arm resists. Push as hard as you can with both arms, without causing any pain or movement at your left / right elbow. Hold this position for 3 seconds. Slowly release the tension in both arms. Let your muscles relax completely before repeating. Repeat _______2___ times. Complete this exercise 3 times per week. Exercise M: Elbow Flexion With Forearm Palm Up  Sit upright on a firm chair without armrests, or stand. Place your left / right arm at your side with your palm facing forward. Holding a 5 lbweight or gripping a rubber exercise band or tubing, bend your elbow to bring your hand toward your shoulder. Hold this  position for 3 seconds. Slowly return to the starting position. Repeat 2 times. Complete this exercise 3 times per week. Exercise N: Elbow Extension  Sit on a firm chair without armrests, or stand. Keeping your upper arms at your sides, bring both hands up toward your left / right shoulder while you grip a rubber exercise band or tubing. Your left / right hand should be just below the other hand. Straighten your left / right elbow. Hold this position for 3 seconds. Control the resistance of the band or tubing as your hand returns to your side. Repeat 2 times. Complete this exercise 3 times per week. Exercise O: Forearm Rotation, Supination  Sit with your left / right forearm supported on a table. Keep your elbow at waist height. Rest your hand over the edge of the table with your palm facing down. Gently hold a lightweight hammer. Without moving your elbow, slowly rotate your forearm to turn your palm and hand upward to a "thumbs-up" position. Hold this position for 3 seconds. Slowly return to the starting position. Repeat 2 times. Complete this exercise 3 times per week. Exercise P: Forearm Rotation, Pronation  Sit with your left / right forearm supported on a table. Keep your elbow below shoulder height. Rest your hand over the edge of the table with your palm facing up. Gently hold a lightweight hammer. Without moving your elbow, slowly rotate your forearm to turn your palm and hand upward to a "thumbs-up" position. Hold this position for 3 seconds. Slowly return to the starting position. Repeat 2 times. Complete this exercise 3 times per week.  Make sure you discuss any questions you have with your health care provider. Document Released: 10/20/2005 Document Revised: 04/15/2016 Document Reviewed: 08/31/2015 Elsevier Interactive Patient Education  [employer].

## 2023-09-06 ENCOUNTER — Telehealth: Payer: Self-pay | Admitting: Family Medicine

## 2023-09-06 ENCOUNTER — Other Ambulatory Visit: Payer: Self-pay | Admitting: Family Medicine

## 2023-09-06 ENCOUNTER — Encounter: Payer: Self-pay | Admitting: Family Medicine

## 2023-09-06 MED ORDER — RYBELSUS 7 MG PO TABS: 7.0000 mg | ORAL_TABLET | Freq: Every day | ORAL | 2 refills | Status: DC

## 2023-09-06 MED ORDER — ROSUVASTATIN CALCIUM 40 MG PO TABS: 40.0000 mg | ORAL_TABLET | Freq: Every day | ORAL | 3 refills | Status: DC

## 2023-09-06 MED ORDER — RYBELSUS 3 MG PO TABS: 3.0000 mg | ORAL_TABLET | Freq: Every day | ORAL | 0 refills | Status: DC

## 2023-09-06 NOTE — Telephone Encounter (Signed)
Pharmacy states they received 3mg  and 7mg  of rybelsus and did not know which one was the right rx. Our records indicate 3mg  but please confirm rx. Please advise.

## 2023-09-06 NOTE — Telephone Encounter (Signed)
Clarified with pharmacy PCP sent in both. He is to do the 3 mg the first month, then go up to the 7 mg and to continue on the 7 mg.

## 2023-10-03 LAB — HM DIABETES EYE EXAM

## 2023-10-04 ENCOUNTER — Ambulatory Visit: Payer: 59 | Admitting: Family Medicine

## 2023-10-05 ENCOUNTER — Other Ambulatory Visit: Payer: Self-pay | Admitting: Family Medicine

## 2023-10-05 DIAGNOSIS — E1165 Type 2 diabetes mellitus with hyperglycemia: Secondary | ICD-10-CM

## 2023-11-14 ENCOUNTER — Encounter: Payer: Self-pay | Admitting: Family Medicine

## 2023-11-14 ENCOUNTER — Ambulatory Visit (INDEPENDENT_AMBULATORY_CARE_PROVIDER_SITE_OTHER): Payer: 59 | Admitting: Family Medicine

## 2023-11-14 VITALS — BP 126/74 | HR 96 | Temp 98.0°F | Resp 16 | Ht 76.0 in | Wt 267.0 lb

## 2023-11-14 DIAGNOSIS — E1165 Type 2 diabetes mellitus with hyperglycemia: Secondary | ICD-10-CM | POA: Diagnosis not present

## 2023-11-14 DIAGNOSIS — Z7984 Long term (current) use of oral hypoglycemic drugs: Secondary | ICD-10-CM

## 2023-11-14 DIAGNOSIS — Z7985 Long-term (current) use of injectable non-insulin antidiabetic drugs: Secondary | ICD-10-CM

## 2023-11-14 MED ORDER — GLYBURIDE 5 MG PO TABS
ORAL_TABLET | ORAL | Status: DC
Start: 2023-11-14 — End: 2024-04-11

## 2023-11-14 NOTE — Progress Notes (Signed)
Subjective:   Chief Complaint  Patient presents with   Follow-up    Follow up labs    William Adkins is a 57 y.o. male here for follow-up of diabetes.   William Adkins's self monitored glucose range is mid 100's.  Sugars are spiking in the evenings. Patient denies hypoglycemic reactions. He checks his glucose levels 2 time(s) per week.  Patient does not require insulin.   Medications include: Actos 30 mg/d, Jardiance 25 mg/d Diet is OK.  Exercise: some walking No Cp or SOB.   Past Medical History:  Diagnosis Date   Allergy    Diabetes mellitus without complication (HCC)    Hypertension      Related testing: Retinal exam: Done Pneumovax: done  Objective:  BP 126/74 (BP Location: Left Arm, Patient Position: Sitting, Cuff Size: Large)   Pulse 96   Temp 98 F (36.7 C) (Oral)   Resp 16   Ht 6\' 4"  (1.93 m)   Wt 267 lb (121.1 kg)   SpO2 95%   BMI 32.50 kg/m  General:  Well developed, well nourished, in no apparent distress Lungs:  CTAB, no access msc use Cardio:  RRR, no bruits, no LE edema Psych: Age appropriate judgment and insight  Assessment:   Type 2 diabetes mellitus with hyperglycemia, without long-term current use of insulin (HCC) - Plan: glyBURIDE (DIABETA) 5 MG tablet   Plan:   Chronic, not controlled.  Continue Actos 30 mg daily, Jardiance 25 mg daily.  Add glyburide 2.5 mg in the afternoon to help with the spikes.  Counseled on diet and exercise. F/u in 6 weeks to recheck. The patient voiced understanding and agreement to the plan.  Jilda Roche [place], DO 11/14/23 1:51 PM

## 2023-11-14 NOTE — Patient Instructions (Addendum)
Take 1/2 tab of the glyburide in the afternoon.  Stay on the Jardiance and Actos daily.   Keep the diet clean and stay active.   Let us know if you need anything.

## 2023-12-26 ENCOUNTER — Ambulatory Visit: Payer: 59 | Admitting: Family Medicine

## 2024-01-30 ENCOUNTER — Ambulatory Visit (INDEPENDENT_AMBULATORY_CARE_PROVIDER_SITE_OTHER): Payer: 59 | Admitting: Family Medicine

## 2024-01-30 ENCOUNTER — Encounter: Payer: Self-pay | Admitting: Family Medicine

## 2024-01-30 ENCOUNTER — Other Ambulatory Visit: Payer: Self-pay | Admitting: Family Medicine

## 2024-01-30 VITALS — BP 122/78 | HR 81 | Temp 98.0°F | Resp 16 | Ht 76.0 in | Wt 270.0 lb

## 2024-01-30 DIAGNOSIS — Z7984 Long term (current) use of oral hypoglycemic drugs: Secondary | ICD-10-CM | POA: Diagnosis not present

## 2024-01-30 DIAGNOSIS — E1165 Type 2 diabetes mellitus with hyperglycemia: Secondary | ICD-10-CM | POA: Diagnosis not present

## 2024-01-30 LAB — HEMOGLOBIN A1C: Hgb A1c MFr Bld: 7.3 % — ABNORMAL HIGH (ref 4.6–6.5)

## 2024-01-30 MED ORDER — PIOGLITAZONE HCL 45 MG PO TABS: 45.0000 mg | ORAL_TABLET | Freq: Every day | ORAL | 1 refills | Status: DC

## 2024-01-30 NOTE — Progress Notes (Signed)
 Subjective:   Chief Complaint  Patient presents with   Follow-up    Follow up    William Adkins is a 58 y.o. male here for follow-up of diabetes.   William Adkins's self monitored glucose range is low 100's.  Patient denies hypoglycemic reactions. He checks his glucose levels 1 time per week. Patient does not require insulin.   Medications include: glyburide  2.5 mg daily, Jardiance  25 mg/d, Actos  30 mg/d Diet is not the healthiest.  Exercise: some walking No CP or SOB.   Past Medical History:  Diagnosis Date   Allergy    Diabetes mellitus without complication (HCC)    Hypertension      Related testing: Retinal exam: Done Pneumovax: done  Objective:  BP 122/78   Pulse 81   Temp 98 F (36.7 C) (Oral)   Resp 16   Ht 6\' 4"  (1.93 m)   Wt 270 lb (122.5 kg)   SpO2 98%   BMI 32.87 kg/m  General:  Well developed, well nourished, in no apparent distress Lungs:  CTAB, no access msc use Cardio:  RRR, no bruits, no LE edema Psych: Age appropriate judgment and insight  Assessment:   Type 2 diabetes mellitus with hyperglycemia, without long-term current use of insulin (HCC) - Plan: Hemoglobin A1c   Plan:   Chronic, unstable. Cont Jardiance  25 mg/d, glyburide  2.5 mg/d. Will likely increase the Actos  from 30 mg/d to 45 mg/d. Counseled on diet and exercise. F/u in 3 mo. The patient voiced understanding and agreement to the plan.  William Dials [place], DO 01/30/24 1:10 PM

## 2024-01-30 NOTE — Patient Instructions (Signed)
 Give us  2-3 business days to get the results of your labs back. If not controlled, we will reconvene in 3 months and max out the dosage of your Actos .   Keep the diet clean and stay active.  Aim to do some physical exertion for 150 minutes per week. This is typically divided into 5 days per week, 30 minutes per day. The activity should be enough to get your heart rate up. Anything is better than nothing if you have time constraints.  Let us  know if you need anything.

## 2024-04-11 ENCOUNTER — Other Ambulatory Visit: Payer: Self-pay | Admitting: Family Medicine

## 2024-04-11 DIAGNOSIS — E1165 Type 2 diabetes mellitus with hyperglycemia: Secondary | ICD-10-CM

## 2024-04-11 DIAGNOSIS — I10 Essential (primary) hypertension: Secondary | ICD-10-CM

## 2024-04-12 ENCOUNTER — Encounter: Payer: Self-pay | Admitting: Pharmacist

## 2024-04-12 NOTE — Progress Notes (Signed)
 04/12/2024 Name: William Adkins MRN: [id number] DOB: 1966/05/06  Chief Complaint  Patient presents with   Medication Management   Diabetes    William Adkins is a 58 y.o. year old male. While on the phone for a visit with his wife, she mentions that patient has had high cost for Jardiance .   Called his pharmacy and [facility] cost after insurance for Jardiance  25mg  #30 was $632.35. Patient used a copay card but cost of medication was still > $400.   Other medications for blood glucose are pioglitazone  45mg  daily and glyburide  5mg  daily.   Patient has declined metformin and insulin in the past.   Reviewed medication list and refill history- patient is past due to fill. Rosuvastatin . He should have 1 more refill at his pharmacy.    Objective:  Lab Results  Component Value Date   HGBA1C 7.3 (H) 01/30/2024    Lab Results  Component Value Date   CREATININE 1.08 09/05/2023   BUN 26 (H) 09/05/2023   NA 140 09/05/2023   K 4.7 09/05/2023   CL 99 09/05/2023   CO2 30 09/05/2023    Lab Results  Component Value Date   CHOL 226 (H) 09/05/2023   HDL 45.40 09/05/2023   LDLCALC 143 (H) 09/05/2023   LDLDIRECT 121.0 05/06/2023   TRIG 186.0 (H) 09/05/2023   CHOLHDL 5 09/05/2023    Medications Reviewed Today     Reviewed by Cecilie Coffee, RPH-CPP (Pharmacist) on 04/12/24 at 1629  Med List Status: <None>   Medication Order Taking? Sig Documenting Provider Last Dose Status Informant  amLODipine  (NORVASC ) 10 MG tablet [id number] No Take 1 tablet (10 mg total) by mouth daily. Jobe Mulder, DO Taking Active   chlorthalidone  (HYGROTON ) 25 MG tablet [id number] No Take 1 tablet (25 mg total) by mouth daily. Jobe Mulder, DO Taking Active   empagliflozin  (JARDIANCE ) 25 MG TABS tablet [id number] No Take 1 tablet (25 mg total) by mouth daily before breakfast. Jobe Mulder, DO Taking Active   esomeprazole (NEXIUM) 20 MG capsule [id number] No Take 20  mg by mouth daily at 12 noon. [provider] Taking Active   glyBURIDE  (DIABETA ) 5 MG tablet [id number]  TAKE 1 TABLET BY MOUTH EVERY DAY WITH BREAKFAST Jobe Mulder, DO  Active   montelukast  (SINGULAIR ) 10 MG tablet [id number] No TAKE 1 TABLET BY MOUTH AT BEDTIME Jobe Mulder, DO Taking Active   Multiple Vitamins-Minerals (ONE-A-DAY 50 PLUS PO) [id number] No Take 1 tablet by mouth daily. [provider] Taking Active   olmesartan  (BENICAR ) 40 MG tablet [id number]  TAKE 1 TABLET BY MOUTH EVERY DAY Wendling, Shellie Dials, DO  Active   pioglitazone  (ACTOS ) 45 MG tablet [id number]  Take 1 tablet (45 mg total) by mouth daily. Jobe Mulder, DO  Active   Probiotic Product (PROBIOTIC ADVANCED PO) [id number] No Take 1 capsule by mouth daily. [provider] Taking Active   rosuvastatin  (CRESTOR ) 40 MG tablet [id number] No Take 1 tablet (40 mg total) by mouth daily. Jobe Mulder, DO Taking Active               Assessment/Plan:   Medication Access: - Called insurance plan. Patient has $2200 deductible to meet, then cost of Jardiance  will be 25% of medication cost or around $250 per month.  - They could not provided an exact amount he has met so far but [facility] ran a test claim and his next coapy will be around $  380. He already has a copay card for Jardiance  which will lower cost by $175 more. Expect his cost to be around $205 for next fill and then $75 thereafter.  - He would not qualify for medication assistance program due to having commercial insurance.    Cecilie Coffee, PharmD Clinical Pharmacist [facility]  Population Health [phone]

## 2024-05-23 ENCOUNTER — Other Ambulatory Visit: Payer: Self-pay | Admitting: Family Medicine

## 2024-05-23 DIAGNOSIS — I10 Essential (primary) hypertension: Secondary | ICD-10-CM

## 2024-06-08 ENCOUNTER — Other Ambulatory Visit: Payer: Self-pay | Admitting: Family Medicine

## 2024-06-08 DIAGNOSIS — E1165 Type 2 diabetes mellitus with hyperglycemia: Secondary | ICD-10-CM

## 2024-06-28 ENCOUNTER — Other Ambulatory Visit: Payer: Self-pay | Admitting: Family Medicine

## 2024-07-06 ENCOUNTER — Other Ambulatory Visit: Payer: Self-pay | Admitting: Family Medicine

## 2024-07-06 DIAGNOSIS — I10 Essential (primary) hypertension: Secondary | ICD-10-CM

## 2024-07-06 MED ORDER — CHLORTHALIDONE 25 MG PO TABS: 25.0000 mg | ORAL_TABLET | Freq: Every day | ORAL | 0 refills | Status: DC

## 2024-07-06 NOTE — Telephone Encounter (Signed)
 Copied from CRM [id number]. Topic: Clinical - Medication Refill >> Jul 06, 2024 12:05 PM [place] wrote: Medication: chlorthalidone  (HYGROTON ) 25 MG tablet  Has the patient contacted their pharmacy? Yes (Agent: If no, request that the patient contact the pharmacy for the refill. If patient does not wish to contact the pharmacy document the reason why and proceed with request.) (Agent: If yes, when and what did the pharmacy advise?)  This is the patient's preferred pharmacy:  [facility] - [place], [place] - [address] [place] [address] Phone: [phone] Fax: [phone]  Is this the correct pharmacy for this prescription? Yes If no, delete pharmacy and type the correct one.   Has the prescription been filled recently? Yes  Is the patient out of the medication? Yes  Has the patient been seen for an appointment in the last year OR does the patient have an upcoming appointment? Yes  Can we respond through MyChart? Yes  Agent: Please be advised that Rx refills may take up to 3 business days. We ask that you follow-up with your pharmacy.

## 2024-07-11 ENCOUNTER — Ambulatory Visit: Payer: Self-pay | Admitting: Family Medicine

## 2024-07-11 ENCOUNTER — Encounter: Payer: Self-pay | Admitting: Family Medicine

## 2024-07-11 ENCOUNTER — Ambulatory Visit: Admitting: Family Medicine

## 2024-07-11 VITALS — BP 128/76 | HR 75 | Temp 98.0°F | Resp 16 | Ht 76.0 in | Wt 273.0 lb

## 2024-07-11 DIAGNOSIS — E1165 Type 2 diabetes mellitus with hyperglycemia: Secondary | ICD-10-CM

## 2024-07-11 DIAGNOSIS — I10 Essential (primary) hypertension: Secondary | ICD-10-CM

## 2024-07-11 DIAGNOSIS — Z7984 Long term (current) use of oral hypoglycemic drugs: Secondary | ICD-10-CM | POA: Diagnosis not present

## 2024-07-11 LAB — COMPREHENSIVE METABOLIC PANEL WITH GFR
ALT: 32 U/L (ref 0–53)
AST: 26 U/L (ref 0–37)
Albumin: 5 g/dL (ref 3.5–5.2)
Alkaline Phosphatase: 57 U/L (ref 39–117)
BUN: 19 mg/dL (ref 6–23)
CO2: 30 meq/L (ref 19–32)
Calcium: 9.8 mg/dL (ref 8.4–10.5)
Chloride: 99 meq/L (ref 96–112)
Creatinine, Ser: 1.01 mg/dL (ref 0.40–1.50)
GFR: 81.81 mL/min (ref >60.00)
Glucose, Bld: 101 mg/dL — ABNORMAL HIGH (ref 70–99)
Potassium: 4 meq/L (ref 3.5–5.1)
Sodium: 139 meq/L (ref 135–145)
Total Bilirubin: 0.6 mg/dL (ref 0.2–1.2)
Total Protein: 7.5 g/dL (ref 6.0–8.3)

## 2024-07-11 LAB — LIPID PANEL
Cholesterol: 174 mg/dL (ref 0–200)
HDL: 47.7 mg/dL (ref >39.00)
LDL Cholesterol: 91 mg/dL (ref 0–99)
NonHDL: 125.81
Total CHOL/HDL Ratio: 4
Triglycerides: 174 mg/dL — ABNORMAL HIGH (ref 0.0–149.0)
VLDL: 34.8 mg/dL (ref 0.0–40.0)

## 2024-07-11 LAB — MICROALBUMIN / CREATININE URINE RATIO
Creatinine,U: 33.4 mg/dL
Microalb Creat Ratio: UNDETERMINED mg/g (ref 0.0–30.0)
Microalb, Ur: <0.7 mg/dL

## 2024-07-11 LAB — HEMOGLOBIN A1C: Hgb A1c MFr Bld: 7.6 % — ABNORMAL HIGH (ref 4.6–6.5)

## 2024-07-11 NOTE — Patient Instructions (Signed)
 Give us  2-3 business days to get the results of your labs back.   Keep the diet clean and stay active.  Aim to do some physical exertion for 150 minutes per week. This is typically divided into 5 days per week, 30 minutes per day. The activity should be enough to get your heart rate up. Anything is better than nothing if you have time constraints.  Stop your Crestor  for 2 weeks and send me a message with how you are doing.   Let us  know if you need anything.  EXERCISES  RANGE OF MOTION (ROM) AND STRETCHING EXERCISES These exercises may help you when beginning to rehabilitate your injury. While completing these exercises, remember:  Restoring tissue flexibility helps normal motion to return to the joints. This allows healthier, less painful movement and activity. An effective stretch should be held for at least 30 seconds. A stretch should never be painful. You should only feel a gentle lengthening or release in the stretched tissue.  ROM - Pendulum Bend at the waist so that your right / left arm falls away from your body. Support yourself with your opposite hand on a solid surface, such as a table or a countertop. Your right / left arm should be perpendicular to the ground. If it is not perpendicular, you need to lean over farther. Relax the muscles in your right / left arm and shoulder as much as possible. Gently sway your hips and trunk so they move your right / left arm without any use of your right / left shoulder muscles. Progress your movements so that your right / left arm moves side to side, then forward and backward, and finally, both clockwise and counterclockwise. Complete 10-15 repetitions in each direction. Many people use this exercise to relieve discomfort in their shoulder as well as to gain range of motion. Repeat 2 times. Complete this exercise 3 times per week.  STRETCH - Flexion, Standing Stand with good posture. With an underhand grip on your right / left hand and an  overhand grip on the opposite hand, grasp a broomstick or cane so that your hands are a little more than shoulder-width apart. Keeping your right / left elbow straight and shoulder muscles relaxed, push the stick with your opposite hand to raise your right / left arm in front of your body and then overhead. Raise your arm until you feel a stretch in your right / left shoulder, but before you have increased shoulder pain. Try to avoid shrugging your right / left shoulder as your arm rises by keeping your shoulder blade tucked down and toward your mid-back spine. Hold 30 seconds. Slowly return to the starting position. Repeat 2 times. Complete this exercise 3 times per week.  STRETCH - Internal Rotation Place your right / left hand behind your back, palm-up. Throw a towel or belt over your opposite shoulder. Grasp the towel/belt with your right / left hand. While keeping an upright posture, gently pull up on the towel/belt until you feel a stretch in the front of your right / left shoulder. Avoid shrugging your right / left shoulder as your arm rises by keeping your shoulder blade tucked down and toward your mid-back spine. Hold 30. Release the stretch by lowering your opposite hand. Repeat 2 times. Complete this exercise 3 times per week.  STRETCH - External Rotation and Abduction Stagger your stance through a doorframe. It does not matter which foot is forward. As instructed by your physician, physical therapist or athletic  trainer, place your hands: And forearms above your head and on the door frame. And forearms at head-height and on the door frame. At elbow-height and on the door frame. Keeping your head and chest upright and your stomach muscles tight to prevent over-extending your low-back, slowly shift your weight onto your front foot until you feel a stretch across your chest and/or in the front of your shoulders. Hold 30 seconds. Shift your weight to your back foot to release the  stretch. Repeat 2 times. Complete this stretch 3 times per week.   STRENGTHENING EXERCISES  These exercises may help you when beginning to rehabilitate your injury. They may resolve your symptoms with or without further involvement from your physician, physical therapist or athletic trainer. While completing these exercises, remember:  Muscles can gain both the endurance and the strength needed for everyday activities through controlled exercises. Complete these exercises as instructed by your physician, physical therapist or athletic trainer. Progress the resistance and repetitions only as guided. You may experience muscle soreness or fatigue, but the pain or discomfort you are trying to eliminate should never worsen during these exercises. If this pain does worsen, stop and make certain you are following the directions exactly. If the pain is still present after adjustments, discontinue the exercise until you can discuss the trouble with your clinician. If advised by your physician, during your recovery, avoid activity or exercises which involve actions that place your right / left hand or elbow above your head or behind your back or head. These positions stress the tissues which are trying to heal.  STRENGTH - Scapular Depression and Adduction With good posture, sit on a firm chair. Supported your arms in front of you with pillows, arm rests or a table top. Have your elbows in line with the sides of your body. Gently draw your shoulder blades down and toward your mid-back spine. Gradually increase the tension without tensing the muscles along the top of your shoulders and the back of your neck. Hold for 3 seconds. Slowly release the tension and relax your muscles completely before completing the next repetition. After you have practiced this exercise, remove the arm support and complete it in standing as well as sitting. Repeat 2 times. Complete this exercise 3 times per week.   STRENGTH -  External Rotators Secure a rubber exercise band/tubing to a fixed object so that it is at the same height as your right / left elbow when you are standing or sitting on a firm surface. Stand or sit so that the secured exercise band/tubing is at your side that is not injured. Bend your elbow 90 degrees. Place a folded towel or small pillow under your right / left arm so that your elbow is a few inches away from your side. Keeping the tension on the exercise band/tubing, pull it away from your body, as if pivoting on your elbow. Be sure to keep your body steady so that the movement is only coming from your shoulder rotating. Hold 3 seconds. Release the tension in a controlled manner as you return to the starting position. Repeat 2 times. Complete this exercise 3 times per week.   STRENGTH - Supraspinatus Stand or sit with good posture. Grasp a 2-3 lb weight or an exercise band/tubing so that your hand is thumbs-up, like when you shake hands. Slowly lift your right / left hand from your thigh into the air, traveling about 30 degrees from straight out at your side. Lift your hand to  shoulder height or as far as you can without increasing any shoulder pain. Initially, many people do not lift their hands above shoulder height. Avoid shrugging your right / left shoulder as your arm rises by keeping your shoulder blade tucked down and toward your mid-back spine. Hold for 3 seconds. Control the descent of your hand as you slowly return to your starting position. Repeat 2 times. Complete this exercise 3 times per week.   STRENGTH - Shoulder Extensors Secure a rubber exercise band/tubing so that it is at the height of your shoulders when you are either standing or sitting on a firm arm-less chair. With a thumbs-up grip, grasp an end of the band/tubing in each hand. Straighten your elbows and lift your hands straight in front of you at shoulder height. Step back away from the secured end of band/tubing until  it becomes tense. Squeezing your shoulder blades together, pull your hands down to the sides of your thighs. Do not allow your hands to go behind you. Hold for 3 seconds. Slowly ease the tension on the band/tubing as you reverse the directions and return to the starting position. Repeat 2 times. Complete this exercise 3 times per week.   STRENGTH - Scapular Retractors Secure a rubber exercise band/tubing so that it is at the height of your shoulders when you are either standing or sitting on a firm arm-less chair. With a palm-down grip, grasp an end of the band/tubing in each hand. Straighten your elbows and lift your hands straight in front of you at shoulder height. Step back away from the secured end of band/tubing until it becomes tense. Squeezing your shoulder blades together, draw your elbows back as you bend them. Keep your upper arm lifted away from your body throughout the exercise. Hold 3 seconds. Slowly ease the tension on the band/tubing as you reverse the directions and return to the starting position. Repeat 2 times. Complete this exercise 3 times per week.  STRENGTH - Scapular Depressors Find a sturdy chair without wheels, such as a from a dining room table. Keeping your feet on the floor, lift your bottom from the seat and lock your elbows. Keeping your elbows straight, allow gravity to pull your body weight down. Your shoulders will rise toward your ears. Raise your body against gravity by drawing your shoulder blades down your back, shortening the distance between your shoulders and ears. Although your feet should always maintain contact with the floor, your feet should progressively support less body weight as you get stronger. Hold 3 seconds. In a controlled and slow manner, lower your body weight to begin the next repetition. Repeat 2 times. Complete this exercise 3 times per week.    This information is not intended to replace advice given to you by your health care  provider. Make sure you discuss any questions you have with your health care provider.   Document Released: 10/20/2005 Document Revised: 12/27/2014 Document Reviewed: 03/20/2009 Elsevier Interactive Patient Education [employer]

## 2024-07-11 NOTE — Progress Notes (Signed)
 Subjective:   Chief Complaint  Patient presents with   Medication Refill    Medication Check    William Adkins is a 58 y.o. male here for follow-up of diabetes.   Endrit does not routinely check his sugars.  Intermittently has low sugars in the afternoon where it has been a while since he has eaten.  Patient does not require insulin.   Medications include: Actos  45 mg/d, glyburide  2.5 mg/d, Jardiance  25 mg/d Diet is improving.  Exercise: walking  Hypertension Patient presents for hypertension follow up. He does monitor home blood pressures. He is compliant with medications- chlorthalidone  25 mg/d, Norvasc  10 mg/d, olmesartan  40 mg/d. Patient has these side effects of medication: none Diet/exercise as above. No Cp or SOB.   Past Medical History:  Diagnosis Date   Allergy    Diabetes mellitus without complication (HCC)    Hypertension      Related testing: Retinal exam: Done Pneumovax: done  Objective:  BP 128/76 (BP Location: Left Arm, Patient Position: Sitting)   Pulse 75   Temp 98 F (36.7 C) (Oral)   Resp 16   Ht 6' 4 (1.93 m)   Wt 273 lb (123.8 kg)   SpO2 95%   BMI 33.23 kg/m  General:  Well developed, well nourished, in no apparent distress Lungs:  CTAB, no access msc use Cardio:  RRR, no bruits, no LE edema Psych: Age appropriate judgment and insight  Assessment:   Type 2 diabetes mellitus with hyperglycemia, without long-term current use of insulin (HCC) - Plan: Comprehensive metabolic panel with GFR, Lipid panel, Hemoglobin A1c, Microalbumin / creatinine urine ratio  Essential hypertension   Plan:   Chronic, unstable. Cont glyburide  2.5 mg/d for now. Cont Jardiance  25 mg/d, Actos  45 mg/d. Failed GLP-1's and does not want metformin. Monitor sugars at home. Counseled on diet and exercise. Chronic, stable. Cont chlorthalidone  25 mg/d, Norvasc  10 mg/d, olmesartan  40 mg/d. F/u in 3-6 mo. The patient voiced understanding and agreement to the  plan.  Mabel [place], DO 07/11/24 10:48 AM

## 2024-08-06 ENCOUNTER — Other Ambulatory Visit: Payer: Self-pay | Admitting: Family Medicine

## 2024-08-06 MED ORDER — PRAVASTATIN SODIUM 10 MG PO TABS: 10.0000 mg | ORAL_TABLET | Freq: Every day | ORAL | 3 refills | Status: AC

## 2024-08-25 ENCOUNTER — Other Ambulatory Visit: Payer: Self-pay | Admitting: Family Medicine

## 2024-08-25 DIAGNOSIS — I10 Essential (primary) hypertension: Secondary | ICD-10-CM

## 2024-08-29 ENCOUNTER — Other Ambulatory Visit: Payer: Self-pay | Admitting: Family Medicine

## 2024-08-29 DIAGNOSIS — I10 Essential (primary) hypertension: Secondary | ICD-10-CM

## 2024-09-28 ENCOUNTER — Other Ambulatory Visit: Payer: Self-pay | Admitting: Family Medicine

## 2024-09-28 DIAGNOSIS — I10 Essential (primary) hypertension: Secondary | ICD-10-CM

## 2024-10-08 ENCOUNTER — Encounter: Admitting: Family Medicine

## 2024-10-15 ENCOUNTER — Encounter: Payer: Self-pay | Admitting: Family Medicine

## 2024-10-15 ENCOUNTER — Ambulatory Visit: Admitting: Family Medicine

## 2024-10-15 VITALS — BP 130/82 | HR 100 | Temp 98.0°F | Resp 16 | Ht 76.0 in | Wt 276.4 lb

## 2024-10-15 DIAGNOSIS — E1165 Type 2 diabetes mellitus with hyperglycemia: Secondary | ICD-10-CM | POA: Diagnosis not present

## 2024-10-15 DIAGNOSIS — Z125 Encounter for screening for malignant neoplasm of prostate: Secondary | ICD-10-CM

## 2024-10-15 DIAGNOSIS — Z0000 Encounter for general adult medical examination without abnormal findings: Secondary | ICD-10-CM | POA: Diagnosis not present

## 2024-10-15 DIAGNOSIS — Z23 Encounter for immunization: Secondary | ICD-10-CM | POA: Diagnosis not present

## 2024-10-15 MED ORDER — GLYBURIDE 5 MG PO TABS: ORAL_TABLET | ORAL | 2 refills | Status: AC

## 2024-10-15 NOTE — Patient Instructions (Addendum)
 Give us  2-3 business days to get the results of your labs back.   Keep the diet clean and stay active.  Please get me a copy of your advanced directive form at your convenience.   Please schedule your eye exam.   Let us  know if you need anything.

## 2024-10-15 NOTE — Progress Notes (Signed)
 Chief Complaint  Patient presents with   Annual Exam    CPE    Well Male William Adkins is here for a complete physical.   His last physical was >1 year ago.  Current diet: in general, diet is OK.  Current exercise: walking Weight trend: stable Fatigue out of ordinary? No. Seat belt? Yes.   Advanced directive? No  Health maintenance Shingrix- No Colonoscopy- Yes Tetanus- Yes HIV- Yes Hep C- Yes   Past Medical History:  Diagnosis Date   Allergy    Diabetes mellitus without complication (HCC)    Hypertension       Past Surgical History:  Procedure Laterality Date   colonoscopy     SHOULDER SURGERY  1998   right shoulder states it would disclocate a lot; had to stretch tendon  and staple it to the top    Medications  Current Outpatient Medications on File Prior to Visit  Medication Sig Dispense Refill   amLODipine  (NORVASC ) 10 MG tablet TAKE 1 TABLET BY MOUTH EVERY DAY 90 tablet 2   chlorthalidone  (HYGROTON ) 25 MG tablet TAKE 1 TABLET BY MOUTH DAILY 30 tablet 0   esomeprazole (NEXIUM) 20 MG capsule Take 20 mg by mouth daily at 12 noon.     JARDIANCE  25 MG TABS tablet TAKE 1 TABLET BY MOUTH EVERY DAY BEFORE BREAKFAST 90 tablet 2   montelukast  (SINGULAIR ) 10 MG tablet Take 1 tablet (10 mg total) by mouth at bedtime. 30 tablet 0   Multiple Vitamins-Minerals (ONE-A-DAY 50 PLUS PO) Take 1 tablet by mouth daily.     olmesartan  (BENICAR ) 40 MG tablet TAKE 1 TABLET BY MOUTH EVERY DAY 90 tablet 2   pioglitazone  (ACTOS ) 45 MG tablet Take 1 tablet (45 mg total) by mouth daily. 90 tablet 0   pravastatin  (PRAVACHOL ) 10 MG tablet Take 1 tablet (10 mg total) by mouth daily. 90 tablet 3   Probiotic Product (PROBIOTIC ADVANCED PO) Take 1 capsule by mouth daily.     No current facility-administered medications on file prior to visit.     Allergies No Known Allergies  Family History Family History  Problem Relation Age of Onset   Diabetes Mother    Heart disease Father     Hypertension Father    Diabetes Father    Hypertension Brother    Heart disease Brother    Diabetes Brother    Heart attack Brother    Colon cancer Paternal Aunt    Prostate cancer Neg Hx     Review of Systems: Constitutional:  no fevers Eye:  no recent significant change in vision Ear/Nose/Mouth/Throat:  Ears:  no hearing loss Nose/Mouth/Throat:  no complaints of nasal congestion, no sore throat Cardiovascular:  no chest pain Respiratory:  no shortness of breath Gastrointestinal:  no change in bowel habits GU:  Male: negative for dysuria, frequency Musculoskeletal/Extremities:  no joint pain Integumentary (Skin/Breast):  no abnormal skin lesions reported Neurologic:  no headaches Endocrine: No unexpected weight changes Hematologic/Lymphatic:  no abnormal bleeding  Exam BP 130/82 (BP Location: Left Arm, Patient Position: Sitting)   Pulse 100   Temp 98 F (36.7 C) (Oral)   Resp 16   Ht 6' 4 (1.93 m)   Wt 276 lb 6.4 oz (125.4 kg)   SpO2 98%   BMI 33.64 kg/m  General:  well developed, well nourished, in no apparent distress Skin:  no significant moles, warts, or growths Head:  no masses, lesions, or tenderness Eyes:  pupils equal and round, sclera  anicteric without injection Ears:  canals without lesions, TMs shiny without retraction, no obvious effusion, no erythema Nose:  nares patent, mucosa normal Throat/Pharynx:  lips and gingiva without lesion; tongue and uvula midline; non-inflamed pharynx; no exudates or postnasal drainage Neck: neck supple without adenopathy, thyromegaly, or masses Cardiac: RRR, no bruits, no LE edema Lungs:  clear to auscultation, breath sounds equal bilaterally, no respiratory distress Abdomen: BS+, soft, non-tender, non-distended, no masses or organomegaly noted Rectal: Deferred Musculoskeletal:  symmetrical muscle groups noted without atrophy or deformity Neuro:  gait normal; deep tendon reflexes normal and symmetric Psych: well oriented  with normal range of affect and appropriate judgment/insight  Assessment and Plan  Well adult exam - Plan: CBC, Comprehensive metabolic panel with GFR, Lipid panel  Type 2 diabetes mellitus with hyperglycemia, without long-term current use of insulin (HCC) - Plan: Hemoglobin A1c, glyBURIDE  (DIABETA ) 5 MG tablet  Screening for prostate cancer - Plan: PSA  Need for influenza vaccination - Plan: Flu vaccine trivalent PF, 6mos and older(Flulaval,Afluria,Fluarix,Fluzone)   Well 58 y.o. male. Counseled on diet and exercise. Counseled on risks and benefits of prostate cancer screening with PSA. The patient agrees to undergo testing. Immunizations, labs, and further orders as above. Shingrix rec'd, politely declined.  Flu shot today.  Advanced directive form provided today.  Follow up in 3-6 mo. The patient voiced understanding and agreement to the plan.  Mabel Mt [place], DO 10/15/24 1:57 PM

## 2024-10-16 ENCOUNTER — Other Ambulatory Visit: Payer: Self-pay

## 2024-10-16 ENCOUNTER — Ambulatory Visit: Payer: Self-pay | Admitting: Family Medicine

## 2024-10-16 DIAGNOSIS — E1165 Type 2 diabetes mellitus with hyperglycemia: Secondary | ICD-10-CM

## 2024-10-16 DIAGNOSIS — E781 Pure hyperglyceridemia: Secondary | ICD-10-CM

## 2024-10-16 LAB — HEMOGLOBIN A1C: Hgb A1c MFr Bld: 7.5 % — ABNORMAL HIGH (ref 4.6–6.5)

## 2024-10-16 LAB — COMPREHENSIVE METABOLIC PANEL WITH GFR
ALT: 30 U/L (ref 0–53)
AST: 21 U/L (ref 0–37)
Albumin: 4.7 g/dL (ref 3.5–5.2)
Alkaline Phosphatase: 59 U/L (ref 39–117)
BUN: 22 mg/dL (ref 6–23)
CO2: 31 meq/L (ref 19–32)
Calcium: 9.8 mg/dL (ref 8.4–10.5)
Chloride: 98 meq/L (ref 96–112)
Creatinine, Ser: 1.06 mg/dL (ref 0.40–1.50)
GFR: 77.06 mL/min (ref >60.00)
Glucose, Bld: 132 mg/dL — ABNORMAL HIGH (ref 70–99)
Potassium: 4.2 meq/L (ref 3.5–5.1)
Sodium: 139 meq/L (ref 135–145)
Total Bilirubin: 0.4 mg/dL (ref 0.2–1.2)
Total Protein: 7 g/dL (ref 6.0–8.3)

## 2024-10-16 LAB — LIPID PANEL
Cholesterol: 236 mg/dL — ABNORMAL HIGH (ref 0–200)
HDL: 43.1 mg/dL (ref >39.00)
LDL Cholesterol: 122 mg/dL — ABNORMAL HIGH (ref 0–99)
NonHDL: 192.47
Total CHOL/HDL Ratio: 5
Triglycerides: 354 mg/dL — ABNORMAL HIGH (ref 0.0–149.0)
VLDL: 70.8 mg/dL — ABNORMAL HIGH (ref 0.0–40.0)

## 2024-10-16 LAB — CBC
HCT: 47 % (ref 39.0–52.0)
Hemoglobin: 15.7 g/dL (ref 13.0–17.0)
MCHC: 33.3 g/dL (ref 30.0–36.0)
MCV: 89.1 fl (ref 78.0–100.0)
Platelets: 317 K/uL (ref 150.0–400.0)
RBC: 5.27 Mil/uL (ref 4.22–5.81)
RDW: 14 % (ref 11.5–15.5)
WBC: 6.3 K/uL (ref 4.0–10.5)

## 2024-10-16 LAB — PSA: PSA: 2.84 ng/mL (ref 0.10–4.00)

## 2024-10-22 ENCOUNTER — Telehealth: Payer: Self-pay

## 2024-10-22 NOTE — Progress Notes (Unsigned)
 Complex Care Management Note Care Guide Note  10/22/2024 Name: William Adkins MRN: [id number] DOB: 09-Oct-1966   Complex Care Management Outreach Attempts: A second unsuccessful outreach was attempted today to offer the patient with information about available complex care management services.  Follow Up Plan:  Additional outreach attempts will be made to offer the patient complex care management information and services.   Encounter Outcome:  No Answer  Dreama [facility]  [facility], [facility] VBCI Assistant Direct Dial: [phone]  Fax: [phone]

## 2024-10-25 NOTE — Progress Notes (Signed)
 Complex Care Management Note Care Guide Note  10/25/2024 Name: William Adkins MRN: [id number] DOB: [birth year]   Complex Care Management Outreach Attempts: A third unsuccessful outreach was attempted today to offer the patient with information about available complex care management services.  Follow Up Plan:  No further outreach attempts will be made at this time. We have been unable to contact the patient to offer or enroll patient in complex care management services.  Encounter Outcome:  No Answer  Dreama [facility]  [facility], [facility] VBCI Assistant Direct Dial: [phone]  Fax: [phone]

## 2024-11-13 ENCOUNTER — Other Ambulatory Visit: Payer: Self-pay | Admitting: Family Medicine

## 2024-11-13 DIAGNOSIS — I10 Essential (primary) hypertension: Secondary | ICD-10-CM

## 2024-12-10 ENCOUNTER — Other Ambulatory Visit

## 2024-12-17 ENCOUNTER — Other Ambulatory Visit (INDEPENDENT_AMBULATORY_CARE_PROVIDER_SITE_OTHER)

## 2024-12-17 ENCOUNTER — Ambulatory Visit: Payer: Self-pay | Admitting: Family Medicine

## 2024-12-17 DIAGNOSIS — E781 Pure hyperglyceridemia: Secondary | ICD-10-CM

## 2024-12-17 LAB — LIPID PANEL
Cholesterol: 229 mg/dL — ABNORMAL HIGH (ref 28–200)
HDL: 42.7 mg/dL
LDL Cholesterol: 126 mg/dL — ABNORMAL HIGH (ref 10–99)
NonHDL: 186.51
Total CHOL/HDL Ratio: 5
Triglycerides: 304 mg/dL — ABNORMAL HIGH (ref 10.0–149.0)
VLDL: 60.8 mg/dL — ABNORMAL HIGH (ref 0.0–40.0)

## 2025-01-16 ENCOUNTER — Other Ambulatory Visit: Payer: Self-pay | Admitting: Family Medicine

## 2025-01-16 DIAGNOSIS — I10 Essential (primary) hypertension: Secondary | ICD-10-CM
